# Patient Record
Sex: Male | Born: 1955 | Race: White | Hispanic: No | Marital: Married | Smoking: Former smoker
Health system: Southern US, Community
[De-identification: ages and names within clinical notes are randomized; demographics above are authoritative.]

## PROBLEM LIST (undated history)

## (undated) DIAGNOSIS — E7800 Pure hypercholesterolemia, unspecified: Secondary | ICD-10-CM

## (undated) DIAGNOSIS — I10 Essential (primary) hypertension: Secondary | ICD-10-CM

## (undated) HISTORY — DX: Pure hypercholesterolemia, unspecified: E78.00

## (undated) HISTORY — PX: VASECTOMY: SHX75

## (undated) HISTORY — PX: HERNIA REPAIR: SHX51

## (undated) HISTORY — DX: Essential (primary) hypertension: I10

---

## 2016-01-07 LAB — HM COLONOSCOPY

## 2018-08-29 DIAGNOSIS — I493 Ventricular premature depolarization: Secondary | ICD-10-CM | POA: Diagnosis not present

## 2018-08-29 DIAGNOSIS — Z01810 Encounter for preprocedural cardiovascular examination: Secondary | ICD-10-CM | POA: Diagnosis not present

## 2018-08-29 DIAGNOSIS — M7542 Impingement syndrome of left shoulder: Secondary | ICD-10-CM | POA: Diagnosis not present

## 2018-08-29 DIAGNOSIS — M19012 Primary osteoarthritis, left shoulder: Secondary | ICD-10-CM | POA: Diagnosis not present

## 2018-08-29 DIAGNOSIS — M75122 Complete rotator cuff tear or rupture of left shoulder, not specified as traumatic: Secondary | ICD-10-CM | POA: Diagnosis not present

## 2018-09-23 DIAGNOSIS — S46112A Strain of muscle, fascia and tendon of long head of biceps, left arm, initial encounter: Secondary | ICD-10-CM | POA: Diagnosis not present

## 2018-09-23 DIAGNOSIS — M7542 Impingement syndrome of left shoulder: Secondary | ICD-10-CM | POA: Diagnosis not present

## 2018-09-23 DIAGNOSIS — S46212A Strain of muscle, fascia and tendon of other parts of biceps, left arm, initial encounter: Secondary | ICD-10-CM | POA: Diagnosis not present

## 2018-09-23 DIAGNOSIS — M75122 Complete rotator cuff tear or rupture of left shoulder, not specified as traumatic: Secondary | ICD-10-CM | POA: Diagnosis not present

## 2018-09-23 DIAGNOSIS — S43432A Superior glenoid labrum lesion of left shoulder, initial encounter: Secondary | ICD-10-CM | POA: Diagnosis not present

## 2018-09-23 DIAGNOSIS — X58XXXA Exposure to other specified factors, initial encounter: Secondary | ICD-10-CM | POA: Diagnosis not present

## 2018-09-23 DIAGNOSIS — G8918 Other acute postprocedural pain: Secondary | ICD-10-CM | POA: Diagnosis not present

## 2018-09-23 DIAGNOSIS — M19012 Primary osteoarthritis, left shoulder: Secondary | ICD-10-CM | POA: Diagnosis not present

## 2018-10-26 DIAGNOSIS — Z4789 Encounter for other orthopedic aftercare: Secondary | ICD-10-CM | POA: Diagnosis not present

## 2018-11-01 DIAGNOSIS — Z4789 Encounter for other orthopedic aftercare: Secondary | ICD-10-CM | POA: Diagnosis not present

## 2018-11-09 DIAGNOSIS — Z4789 Encounter for other orthopedic aftercare: Secondary | ICD-10-CM | POA: Diagnosis not present

## 2018-11-16 DIAGNOSIS — Z4789 Encounter for other orthopedic aftercare: Secondary | ICD-10-CM | POA: Diagnosis not present

## 2018-11-21 DIAGNOSIS — Z4789 Encounter for other orthopedic aftercare: Secondary | ICD-10-CM | POA: Diagnosis not present

## 2018-11-23 DIAGNOSIS — Z4789 Encounter for other orthopedic aftercare: Secondary | ICD-10-CM | POA: Diagnosis not present

## 2018-11-28 DIAGNOSIS — Z4789 Encounter for other orthopedic aftercare: Secondary | ICD-10-CM | POA: Diagnosis not present

## 2018-12-01 DIAGNOSIS — Z4789 Encounter for other orthopedic aftercare: Secondary | ICD-10-CM | POA: Diagnosis not present

## 2018-12-05 DIAGNOSIS — Z4789 Encounter for other orthopedic aftercare: Secondary | ICD-10-CM | POA: Diagnosis not present

## 2018-12-07 DIAGNOSIS — Z4789 Encounter for other orthopedic aftercare: Secondary | ICD-10-CM | POA: Diagnosis not present

## 2018-12-12 DIAGNOSIS — Z4789 Encounter for other orthopedic aftercare: Secondary | ICD-10-CM | POA: Diagnosis not present

## 2018-12-14 DIAGNOSIS — Z4789 Encounter for other orthopedic aftercare: Secondary | ICD-10-CM | POA: Diagnosis not present

## 2018-12-21 DIAGNOSIS — Z4789 Encounter for other orthopedic aftercare: Secondary | ICD-10-CM | POA: Diagnosis not present

## 2018-12-26 DIAGNOSIS — Z4789 Encounter for other orthopedic aftercare: Secondary | ICD-10-CM | POA: Diagnosis not present

## 2019-01-17 DIAGNOSIS — K219 Gastro-esophageal reflux disease without esophagitis: Secondary | ICD-10-CM | POA: Diagnosis not present

## 2019-01-17 DIAGNOSIS — I10 Essential (primary) hypertension: Secondary | ICD-10-CM | POA: Diagnosis not present

## 2019-01-17 DIAGNOSIS — Z125 Encounter for screening for malignant neoplasm of prostate: Secondary | ICD-10-CM | POA: Diagnosis not present

## 2019-01-17 DIAGNOSIS — E785 Hyperlipidemia, unspecified: Secondary | ICD-10-CM | POA: Diagnosis not present

## 2019-01-17 DIAGNOSIS — Z0000 Encounter for general adult medical examination without abnormal findings: Secondary | ICD-10-CM | POA: Diagnosis not present

## 2019-01-17 DIAGNOSIS — K5730 Diverticulosis of large intestine without perforation or abscess without bleeding: Secondary | ICD-10-CM | POA: Diagnosis not present

## 2019-01-17 DIAGNOSIS — I129 Hypertensive chronic kidney disease with stage 1 through stage 4 chronic kidney disease, or unspecified chronic kidney disease: Secondary | ICD-10-CM | POA: Diagnosis not present

## 2019-01-18 DIAGNOSIS — N200 Calculus of kidney: Secondary | ICD-10-CM | POA: Diagnosis not present

## 2019-01-18 DIAGNOSIS — N183 Chronic kidney disease, stage 3 (moderate): Secondary | ICD-10-CM | POA: Diagnosis not present

## 2019-01-18 DIAGNOSIS — N281 Cyst of kidney, acquired: Secondary | ICD-10-CM | POA: Diagnosis not present

## 2019-01-25 DIAGNOSIS — I10 Essential (primary) hypertension: Secondary | ICD-10-CM | POA: Diagnosis not present

## 2019-01-25 DIAGNOSIS — N189 Chronic kidney disease, unspecified: Secondary | ICD-10-CM | POA: Diagnosis not present

## 2019-01-25 DIAGNOSIS — N183 Chronic kidney disease, stage 3 (moderate): Secondary | ICD-10-CM | POA: Diagnosis not present

## 2019-01-25 DIAGNOSIS — M3210 Systemic lupus erythematosus, organ or system involvement unspecified: Secondary | ICD-10-CM | POA: Diagnosis not present

## 2019-01-25 DIAGNOSIS — R319 Hematuria, unspecified: Secondary | ICD-10-CM | POA: Diagnosis not present

## 2019-01-25 DIAGNOSIS — R309 Painful micturition, unspecified: Secondary | ICD-10-CM | POA: Diagnosis not present

## 2019-01-25 DIAGNOSIS — R809 Proteinuria, unspecified: Secondary | ICD-10-CM | POA: Diagnosis not present

## 2019-01-25 DIAGNOSIS — M109 Gout, unspecified: Secondary | ICD-10-CM | POA: Diagnosis not present

## 2019-01-25 DIAGNOSIS — E119 Type 2 diabetes mellitus without complications: Secondary | ICD-10-CM | POA: Diagnosis not present

## 2019-02-07 DIAGNOSIS — I10 Essential (primary) hypertension: Secondary | ICD-10-CM | POA: Diagnosis not present

## 2019-02-08 DIAGNOSIS — I10 Essential (primary) hypertension: Secondary | ICD-10-CM | POA: Diagnosis not present

## 2019-02-08 DIAGNOSIS — N281 Cyst of kidney, acquired: Secondary | ICD-10-CM | POA: Diagnosis not present

## 2019-02-24 DIAGNOSIS — N189 Chronic kidney disease, unspecified: Secondary | ICD-10-CM | POA: Diagnosis not present

## 2019-06-13 DIAGNOSIS — I10 Essential (primary) hypertension: Secondary | ICD-10-CM | POA: Diagnosis not present

## 2019-06-13 DIAGNOSIS — R309 Painful micturition, unspecified: Secondary | ICD-10-CM | POA: Diagnosis not present

## 2019-06-13 DIAGNOSIS — D649 Anemia, unspecified: Secondary | ICD-10-CM | POA: Diagnosis not present

## 2019-06-13 DIAGNOSIS — N183 Chronic kidney disease, stage 3 unspecified: Secondary | ICD-10-CM | POA: Diagnosis not present

## 2019-06-13 DIAGNOSIS — R809 Proteinuria, unspecified: Secondary | ICD-10-CM | POA: Diagnosis not present

## 2019-06-13 DIAGNOSIS — N189 Chronic kidney disease, unspecified: Secondary | ICD-10-CM | POA: Diagnosis not present

## 2019-06-13 DIAGNOSIS — M109 Gout, unspecified: Secondary | ICD-10-CM | POA: Diagnosis not present

## 2019-10-16 DIAGNOSIS — N183 Chronic kidney disease, stage 3 unspecified: Secondary | ICD-10-CM | POA: Diagnosis not present

## 2019-10-16 DIAGNOSIS — D649 Anemia, unspecified: Secondary | ICD-10-CM | POA: Diagnosis not present

## 2019-10-16 DIAGNOSIS — M109 Gout, unspecified: Secondary | ICD-10-CM | POA: Diagnosis not present

## 2019-10-16 DIAGNOSIS — N189 Chronic kidney disease, unspecified: Secondary | ICD-10-CM | POA: Diagnosis not present

## 2019-10-16 DIAGNOSIS — I10 Essential (primary) hypertension: Secondary | ICD-10-CM | POA: Diagnosis not present

## 2019-10-28 DIAGNOSIS — R109 Unspecified abdominal pain: Secondary | ICD-10-CM | POA: Diagnosis not present

## 2019-12-07 DIAGNOSIS — M19049 Primary osteoarthritis, unspecified hand: Secondary | ICD-10-CM | POA: Diagnosis not present

## 2020-01-31 DIAGNOSIS — D649 Anemia, unspecified: Secondary | ICD-10-CM | POA: Diagnosis not present

## 2020-01-31 DIAGNOSIS — M109 Gout, unspecified: Secondary | ICD-10-CM | POA: Diagnosis not present

## 2020-01-31 DIAGNOSIS — Z125 Encounter for screening for malignant neoplasm of prostate: Secondary | ICD-10-CM | POA: Diagnosis not present

## 2020-01-31 DIAGNOSIS — I10 Essential (primary) hypertension: Secondary | ICD-10-CM | POA: Diagnosis not present

## 2020-01-31 DIAGNOSIS — E7800 Pure hypercholesterolemia, unspecified: Secondary | ICD-10-CM | POA: Diagnosis not present

## 2020-01-31 DIAGNOSIS — N183 Chronic kidney disease, stage 3 unspecified: Secondary | ICD-10-CM | POA: Diagnosis not present

## 2020-01-31 DIAGNOSIS — N189 Chronic kidney disease, unspecified: Secondary | ICD-10-CM | POA: Diagnosis not present

## 2020-02-12 DIAGNOSIS — N189 Chronic kidney disease, unspecified: Secondary | ICD-10-CM | POA: Diagnosis not present

## 2020-05-02 ENCOUNTER — Ambulatory Visit (INDEPENDENT_AMBULATORY_CARE_PROVIDER_SITE_OTHER): Payer: BC Managed Care – PPO | Admitting: Family Medicine

## 2020-05-02 ENCOUNTER — Encounter: Payer: Self-pay | Admitting: Family Medicine

## 2020-05-02 VITALS — BP 158/60 | HR 63 | Temp 98.1°F | Ht 67.72 in | Wt 233.4 lb

## 2020-05-02 DIAGNOSIS — I10 Essential (primary) hypertension: Secondary | ICD-10-CM | POA: Insufficient documentation

## 2020-05-02 DIAGNOSIS — E785 Hyperlipidemia, unspecified: Secondary | ICD-10-CM

## 2020-05-02 DIAGNOSIS — M722 Plantar fascial fibromatosis: Secondary | ICD-10-CM | POA: Diagnosis not present

## 2020-05-02 MED ORDER — EZETIMIBE 10 MG PO TABS: 10.0000 mg | ORAL_TABLET | Freq: Every day | ORAL | 3 refills | Status: DC

## 2020-05-02 NOTE — Assessment & Plan Note (Signed)
He has done well with zetia, will continue this for now.  Update lipid panel at future physical.

## 2020-05-02 NOTE — Progress Notes (Signed)
Kevin Li - 64 y.o. male MRN [id number]  Date of birth: 08/16/1965  Subjective Chief Complaint  Patient presents with  . Establish Care    HPI Kevin Li is a 64 y.o. male here today for initial visit.  His previous PCP retired.  He has a history of HTN and HLD.  HTN is currently managed with labetalol 200mg  daily.  He was previously on lisinopril however had AKI so this was discontinued.  He has been seeing nephrology and reports that renal function has returned back to normal.  He denies symptoms related to HTN including chest pain, shortness of breath, headache or vision changes.    Cholesterol is managed with zetia.  He does report taking statin in the past and tolerated well.    He is having bilateral foot pain.  Pain located along heels.  Worse when he first gets up in the morning and improves some after walking around.  He has had some improvement with new inserts for shoes.  Denies numbness or tingling.    ROS:  A comprehensive ROS was completed and negative except as noted per HPI  Allergies  Allergen Reactions  . Codeine Nausea Only    Past Medical History:  Diagnosis Date  . High cholesterol   . Hypertension     Past Surgical History:  Procedure Laterality Date  . HERNIA REPAIR     Age 17  . VASECTOMY      Social History   Socioeconomic History  . Marital status: Married    Spouse name: Not on file  . Number of children: Not on file  . Years of education: Not on file  . Highest education level: Not on file  Occupational History  . Not on file  Tobacco Use  . Smoking status: Former Smoker    Quit date: 07/27/1989    Years since quitting: 30.7  . Smokeless tobacco: Never Used  Vaping Use  . Vaping Use: Never used  Substance and Sexual Activity  . Alcohol use: Not on file  . Drug use: Never  . Sexual activity: Yes    Partners: Female  Other Topics Concern  . Not on file  Social History Narrative  . Not on file   Social Determinants of Health    Financial Resource Strain:   . Difficulty of Paying Living Expenses: Not on file  Food Insecurity:   . Worried About Charity fundraiser in the Last Year: Not on file  . Ran Out of Food in the Last Year: Not on file  Transportation Needs:   . Lack of Transportation (Medical): Not on file  . Lack of Transportation (Non-Medical): Not on file  Physical Activity:   . Days of Exercise per Week: Not on file  . Minutes of Exercise per Session: Not on file  Stress:   . Feeling of Stress : Not on file  Social Connections:   . Frequency of Communication with Friends and Family: Not on file  . Frequency of Social Gatherings with Friends and Family: Not on file  . Attends Religious Services: Not on file  . Active Member of Clubs or Organizations: Not on file  . Attends Archivist Meetings: Not on file  . Marital Status: Not on file    History reviewed. No pertinent family history.  Health Maintenance  Topic Date Due  . Hepatitis C Screening  Never done  . HIV Screening  Never done  . TETANUS/TDAP  Never done  . COLONOSCOPY  Never done  . INFLUENZA VACCINE  10/24/2020 (Originally 02/25/2020)  . COVID-19 Vaccine (1) 05/18/2021 (Originally 08/16/1977)     ----------------------------------------------------------------------------------------------------------------------------------------------------------------------------------------------------------------- Physical Exam BP (!) 158/60 (BP Location: Left Arm, Patient Position: Sitting, Cuff Size: Large)   Pulse 63   Temp 98.1 F (36.7 C)   Ht 5' 7.72" (1.72 m)   Wt 233 lb 6.4 oz (105.9 kg)   SpO2 94%   BMI 35.79 kg/m   Physical Exam Constitutional:      Appearance: Normal appearance.  HENT:     Head: Normocephalic and atraumatic.  Eyes:     General: No scleral icterus. Cardiovascular:     Rate and Rhythm: Normal rate and regular rhythm.  Musculoskeletal:     Cervical back: Neck supple.     Comments: TTP along  bilateral heels at insertion of plantar fascia.  Arches are slightly high.    Skin:    General: Skin is warm and dry.  Neurological:     General: No focal deficit present.     Mental Status: He is alert.  Psychiatric:        Mood and Affect: Mood normal.        Behavior: Behavior normal.     ------------------------------------------------------------------------------------------------------------------------------------------------------------------------------------------------------------------- Assessment and Plan  Essential hypertension Blood pressure is not well controlled in clinic today.  Readings at home and nephrology have been much better.  He will continue labetalol at current dose.   In addition they were instructed to follow a low sodium diet with regular exercise to help to maintain adequate control of blood pressure.    HLD (hyperlipidemia) He has done well with zetia, will continue this for now.  Update lipid panel at future physical.   Bilateral plantar fasciitis Given handout of home exercises.  Recommend icing at home.  Referral to Dr. Raeford Razor to evaluate for orthotics and/or injection.    Meds ordered this encounter  Medications  . ezetimibe (ZETIA) 10 MG tablet    Sig: Take 1 tablet (10 mg total) by mouth daily.    Dispense:  90 tablet    Refill:  3    No follow-ups on file.    This visit occurred during the SARS-CoV-2 public health emergency.  Safety protocols were in place, including screening questions prior to the visit, additional usage of staff PPE, and extensive cleaning of exam room while observing appropriate contact time as indicated for disinfecting solutions.

## 2020-05-02 NOTE — Patient Instructions (Signed)
Plantar Fasciitis  Plantar fasciitis is a painful foot condition that affects the heel. It occurs when the band of tissue that connects the toes to the heel bone (plantar fascia) becomes irritated. This can happen as the result of exercising too much or doing other repetitive activities (overuse injury). The pain from plantar fasciitis can range from mild irritation to severe pain that makes it difficult to walk or move. The pain is usually worse in the morning after sleeping, or after sitting or lying down for a while. Pain may also be worse after long periods of walking or standing. What are the causes? This condition may be caused by:  Standing for long periods of time.  Wearing shoes that do not have good arch support.  Doing activities that put stress on joints (high-impact activities), including running, aerobics, and ballet.  Being overweight.  An abnormal way of walking (gait).  Tight muscles in the back of your lower leg (calf).  High arches in your feet.  Starting a new athletic activity. What are the signs or symptoms? The main symptom of this condition is heel pain. Pain may:  Be worse with first steps after a time of rest, especially in the morning after sleeping or after you have been sitting or lying down for a while.  Be worse after long periods of standing still.  Decrease after 30-45 minutes of activity, such as gentle walking. How is this diagnosed? This condition may be diagnosed based on your medical history and your symptoms. Your health care provider may ask questions about your activity level. Your health care provider will do a physical exam to check for:  A tender area on the bottom of your foot.  A high arch in your foot.  Pain when you move your foot.  Difficulty moving your foot. You may have imaging tests to confirm the diagnosis, such as:  X-rays.  Ultrasound.  MRI. How is this treated? Treatment for plantar fasciitis depends on how  severe your condition is. Treatment may include:  Rest, ice, applying pressure (compression), and raising the affected foot (elevation). This may be called RICE therapy. Your health care provider may recommend RICE therapy along with over-the-counter pain medicines to manage your pain.  Exercises to stretch your calves and your plantar fascia.  A splint that holds your foot in a stretched, upward position while you sleep (night splint).  Physical therapy to relieve symptoms and prevent problems in the future.  Injections of steroid medicine (cortisone) to relieve pain and inflammation.  Stimulating your plantar fascia with electrical impulses (extracorporeal shock wave therapy). This is usually the last treatment option before surgery.  Surgery, if other treatments have not worked after 12 months. Follow these instructions at home:  Managing pain, stiffness, and swelling  If directed, put ice on the painful area: ? Put ice in a plastic bag, or use a frozen bottle of water. ? Place a towel between your skin and the bag or bottle. ? Roll the bottom of your foot over the bag or bottle. ? Do this for 20 minutes, 2-3 times a day.  Wear athletic shoes that have air-sole or gel-sole cushions, or try wearing soft shoe inserts that are designed for plantar fasciitis.  Raise (elevate) your foot above the level of your heart while you are sitting or lying down. Activity  Avoid activities that cause pain. Ask your health care provider what activities are safe for you.  Do physical therapy exercises and stretches as told   by your health care provider.  Try activities and forms of exercise that are easier on your joints (low-impact). Examples include swimming, water aerobics, and biking. General instructions  Take over-the-counter and prescription medicines only as told by your health care provider.  Wear a night splint while sleeping, if told by your health care provider. Loosen the splint  if your toes tingle, become numb, or turn cold and blue.  Maintain a healthy weight, or work with your health care provider to lose weight as needed.  Keep all follow-up visits as told by your health care provider. This is important. Contact a health care provider if you:  Have symptoms that do not go away after caring for yourself at home.  Have pain that gets worse.  Have pain that affects your ability to move or do your daily activities. Summary  Plantar fasciitis is a painful foot condition that affects the heel. It occurs when the band of tissue that connects the toes to the heel bone (plantar fascia) becomes irritated.  The main symptom of this condition is heel pain that may be worse after exercising too much or standing still for a long time.  Treatment varies, but it usually starts with rest, ice, compression, and elevation (RICE therapy) and over-the-counter medicines to manage pain. This information is not intended to replace advice given to you by your health care provider. Make sure you discuss any questions you have with your health care provider. Document Revised: 06/25/2017 Document Reviewed: 05/10/2017 Elsevier Patient Education  2020 [employer]  Plantar Fasciitis Rehab Ask your health care provider which exercises are safe for you. Do exercises exactly as told by your health care provider and adjust them as directed. It is normal to feel mild stretching, pulling, tightness, or discomfort as you do these exercises. Stop right away if you feel sudden pain or your pain gets worse. Do not begin these exercises until told by your health care provider. Stretching and range-of-motion exercises These exercises warm up your muscles and joints and improve the movement and flexibility of your foot. These exercises also help to relieve pain. Plantar fascia stretch  1. Sit with your left / right leg crossed over your opposite knee. 2. Hold your heel with one hand with that  thumb near your arch. With your other hand, hold your toes and gently pull them back toward the top of your foot. You should feel a stretch on the bottom of your toes or your foot (plantar fascia) or both. 3. Hold this stretch for__________ seconds. 4. Slowly release your toes and return to the starting position. Repeat __________ times. Complete this exercise __________ times a day. Gastrocnemius stretch, standing This exercise is also called a calf (gastroc) stretch. It stretches the muscles in the back of the upper calf. 1. Stand with your hands against a wall. 2. Extend your left / right leg behind you, and bend your front knee slightly. 3. Keeping your heels on the floor and your back knee straight, shift your weight toward the wall. Do not arch your back. You should feel a gentle stretch in your upper left / right calf. 4. Hold this position for __________ seconds. Repeat __________ times. Complete this exercise __________ times a day. Soleus stretch, standing This exercise is also called a calf (soleus) stretch. It stretches the muscles in the back of the lower calf. 1. Stand with your hands against a wall. 2. Extend your left / right leg behind you, and bend your front   knee slightly. 3. Keeping your heels on the floor, bend your back knee and shift your weight slightly over your back leg. You should feel a gentle stretch deep in your lower calf. 4. Hold this position for __________ seconds. Repeat __________ times. Complete this exercise __________ times a day. Gastroc and soleus stretch, standing step This exercise stretches the muscles in the back of the lower leg. These muscles are in the upper calf (gastrocnemius) and the lower calf (soleus). 1. Stand with the ball of your left / right foot on a step. The ball of your foot is on the walking surface, right under your toes. 2. Keep your other foot firmly on the same step. 3. Hold on to the wall or a railing for balance. 4. Slowly  lift your other foot, allowing your body weight to press your left / right heel down over the edge of the step. You should feel a stretch in your left / right calf. 5. Hold this position for __________ seconds. 6. Return both feet to the step. 7. Repeat this exercise with a slight bend in your left / right knee. Repeat __________ times with your left / right knee straight and __________ times with your left / right knee bent. Complete this exercise __________ times a day. Balance exercise This exercise builds your balance and strength control of your arch to help take pressure off your plantar fascia. Single leg stand If this exercise is too easy, you can try it with your eyes closed or while standing on a pillow. 1. Without shoes, stand near a railing or in a doorway. You may hold on to the railing or door frame as needed. 2. Stand on your left / right foot. Keep your big toe down on the floor and try to keep your arch lifted. Do not let your foot roll inward. 3. Hold this position for __________ seconds. Repeat __________ times. Complete this exercise __________ times a day. This information is not intended to replace advice given to you by your health care provider. Make sure you discuss any questions you have with your health care provider. Document Revised: 11/03/2018 Document Reviewed: 05/11/2018 Elsevier Patient Education  2020 Else[employer] Inc.  

## 2020-05-02 NOTE — Assessment & Plan Note (Addendum)
Given handout of home exercises.  Recommend icing at home.  Referral to Dr. Raeford Razor to evaluate for orthotics and/or injection.

## 2020-05-02 NOTE — Assessment & Plan Note (Signed)
Blood pressure is not well controlled in clinic today.  Readings at home and nephrology have been much better.  He will continue labetalol at current dose.   In addition they were instructed to follow a low sodium diet with regular exercise to help to maintain adequate control of blood pressure.

## 2020-05-07 ENCOUNTER — Telehealth: Payer: Self-pay | Admitting: Family Medicine

## 2020-05-07 NOTE — Telephone Encounter (Signed)
Received call from patient with questions about his visit on 10/7. During our conversation he mentioned that [facility] was supposed to be sending something for his ezetimibe I asked him if it was requiring a prior auth and he stated yes.  I sent PA through cover my meds and received message that the drug is covered by the plan and does not require a PA.  - CF

## 2020-05-14 ENCOUNTER — Other Ambulatory Visit: Payer: Self-pay

## 2020-05-14 ENCOUNTER — Ambulatory Visit (INDEPENDENT_AMBULATORY_CARE_PROVIDER_SITE_OTHER): Payer: BC Managed Care – PPO | Admitting: Family Medicine

## 2020-05-14 ENCOUNTER — Encounter: Payer: Self-pay | Admitting: Family Medicine

## 2020-05-14 DIAGNOSIS — M722 Plantar fascial fibromatosis: Secondary | ICD-10-CM

## 2020-05-14 NOTE — Progress Notes (Signed)
Kevin Li - 64 y.o. male MRN [id number]  Date of birth: 1955/11/18  SUBJECTIVE:  Including CC & ROS.  Chief Complaint  Patient presents with  . Foot Pain    bilateral x 1.5 years    Kevin Li is a 64 y.o. male that is presenting with bilateral foot pain.  The pain has been ongoing for over a year.  He notices the pain with the first few steps in the morning.  He has been under the plantar aspect of each heel.  No improvement with modalities today.   Review of Systems See HPI   HISTORY: Past Medical, Surgical, Social, and Family History Reviewed & Updated per EMR.   Pertinent Historical Findings include:  Past Medical History:  Diagnosis Date  . High cholesterol   . Hypertension     Past Surgical History:  Procedure Laterality Date  . HERNIA REPAIR     Age 86  . VASECTOMY      No family history on file.  Social History   Socioeconomic History  . Marital status: Married    Spouse name: Not on file  . Number of children: Not on file  . Years of education: Not on file  . Highest education level: Not on file  Occupational History  . Not on file  Tobacco Use  . Smoking status: Former Smoker    Quit date: 07/27/1989    Years since quitting: 30.8  . Smokeless tobacco: Never Used  Vaping Use  . Vaping Use: Never used  Substance and Sexual Activity  . Alcohol use: Not on file  . Drug use: Never  . Sexual activity: Yes    Partners: Female  Other Topics Concern  . Not on file  Social History Narrative  . Not on file   Social Determinants of Health   Financial Resource Strain:   . Difficulty of Paying Living Expenses: Not on file  Food Insecurity:   . Worried About Charity fundraiser in the Last Year: Not on file  . Ran Out of Food in the Last Year: Not on file  Transportation Needs:   . Lack of Transportation (Medical): Not on file  . Lack of Transportation (Non-Medical): Not on file  Physical Activity:   . Days of Exercise per Week: Not on file  .  Minutes of Exercise per Session: Not on file  Stress:   . Feeling of Stress : Not on file  Social Connections:   . Frequency of Communication with Friends and Family: Not on file  . Frequency of Social Gatherings with Friends and Family: Not on file  . Attends Religious Services: Not on file  . Active Member of Clubs or Organizations: Not on file  . Attends Archivist Meetings: Not on file  . Marital Status: Not on file  Intimate Partner Violence:   . Fear of Current or Ex-Partner: Not on file  . Emotionally Abused: Not on file  . Physically Abused: Not on file  . Sexually Abused: Not on file     PHYSICAL EXAM:  VS: BP (!) 145/81   Pulse (!) 55   Ht 5\' 8"  (1.727 m)   Wt 227 lb (103 kg)   BMI 34.52 kg/m  Physical Exam Gen: NAD, alert, cooperative with exam, well-appearing MSK:  Right and left foot clean Tenderness palpation over the calcaneus plantar aspect. No swelling or ecchymosis. Fairly cavus foot. Neurovascular intact  Patient was fitted for a standard, cushioned, semi-rigid orthotic. The orthotic  was heated and afterward the patient stood on the orthotic blank positioned on the orthotic stand. The patient was positioned in subtalar neutral position and 10 degrees of ankle dorsiflexion in a weight bearing stance. After completion of molding, a stable base was applied to the orthotic blank. The blank was ground to a stable position for weight bearing. Size: 10 Pairs: 2 Base: Blue EVA Additional Posting and Padding: None The patient ambulated these, and they were very comfortable.   ASSESSMENT & PLAN:   Bilateral plantar fasciitis Symptoms seem to be consistent with plantar fasciitis.  Has been ongoing for about a year now. -Counseled on home exercise therapy and supportive care. -Orthotics. -Midfoot arch strap. -Could consider injections if no improvement.

## 2020-05-14 NOTE — Patient Instructions (Signed)
Nice to meet you Please try the straps  Please try the exercises   Please send me a message in MyChart with any questions or updates.  Please see me back in 4 weeks.   --Dr. Raeford Razor

## 2020-05-14 NOTE — Assessment & Plan Note (Signed)
Symptoms seem to be consistent with plantar fasciitis.  Has been ongoing for about a year now. -Counseled on home exercise therapy and supportive care. -Orthotics. -Midfoot arch strap. -Could consider injections if no improvement.

## 2020-06-06 DIAGNOSIS — N189 Chronic kidney disease, unspecified: Secondary | ICD-10-CM | POA: Diagnosis not present

## 2020-06-06 DIAGNOSIS — E119 Type 2 diabetes mellitus without complications: Secondary | ICD-10-CM | POA: Diagnosis not present

## 2020-06-06 DIAGNOSIS — M109 Gout, unspecified: Secondary | ICD-10-CM | POA: Diagnosis not present

## 2020-06-06 DIAGNOSIS — R309 Painful micturition, unspecified: Secondary | ICD-10-CM | POA: Diagnosis not present

## 2020-06-06 DIAGNOSIS — D649 Anemia, unspecified: Secondary | ICD-10-CM | POA: Diagnosis not present

## 2020-06-14 ENCOUNTER — Ambulatory Visit (INDEPENDENT_AMBULATORY_CARE_PROVIDER_SITE_OTHER): Payer: BC Managed Care – PPO | Admitting: Family Medicine

## 2020-06-14 ENCOUNTER — Ambulatory Visit (HOSPITAL_BASED_OUTPATIENT_CLINIC_OR_DEPARTMENT_OTHER)
Admission: RE | Admit: 2020-06-14 | Discharge: 2020-06-14 | Disposition: A | Discharge status: Home / Self Care | Payer: BC Managed Care – PPO | Source: Ambulatory Visit | Attending: Family Medicine | Admitting: Family Medicine

## 2020-06-14 ENCOUNTER — Ambulatory Visit: Payer: Self-pay

## 2020-06-14 ENCOUNTER — Other Ambulatory Visit: Payer: Self-pay

## 2020-06-14 VITALS — BP 130/70 | Ht 68.0 in | Wt 224.0 lb

## 2020-06-14 DIAGNOSIS — M722 Plantar fascial fibromatosis: Secondary | ICD-10-CM

## 2020-06-14 DIAGNOSIS — M79672 Pain in left foot: Secondary | ICD-10-CM | POA: Diagnosis not present

## 2020-06-14 DIAGNOSIS — K5730 Diverticulosis of large intestine without perforation or abscess without bleeding: Secondary | ICD-10-CM | POA: Diagnosis not present

## 2020-06-14 DIAGNOSIS — I700 Atherosclerosis of aorta: Secondary | ICD-10-CM | POA: Diagnosis not present

## 2020-06-14 DIAGNOSIS — N400 Enlarged prostate without lower urinary tract symptoms: Secondary | ICD-10-CM | POA: Diagnosis not present

## 2020-06-14 DIAGNOSIS — R300 Dysuria: Secondary | ICD-10-CM | POA: Diagnosis not present

## 2020-06-14 DIAGNOSIS — M79671 Pain in right foot: Secondary | ICD-10-CM | POA: Diagnosis not present

## 2020-06-14 DIAGNOSIS — N200 Calculus of kidney: Secondary | ICD-10-CM | POA: Diagnosis not present

## 2020-06-14 DIAGNOSIS — I708 Atherosclerosis of other arteries: Secondary | ICD-10-CM | POA: Diagnosis not present

## 2020-06-14 MED ORDER — TRIAMCINOLONE ACETONIDE 40 MG/ML IJ SUSP
40.0000 mg | Freq: Once | INTRAMUSCULAR | Status: AC
  Administered 2020-06-14: 40 mg via INTRA_ARTICULAR

## 2020-06-14 NOTE — Assessment & Plan Note (Signed)
Pain is still ongoing.  He has been experiencing this pain for about a year now.  Little improvement with orthotics. -Counseled on home exercise therapy and supportive care. -Injections bilaterally. -X-rays. -Could consider physical therapy or further imaging.

## 2020-06-14 NOTE — Patient Instructions (Signed)
Good to see you Please continue the tennis ball Please use ice as needed  I will call with the results from today   Please send me a message in MyChart with any questions or updates.  Please see me back in 4 weeks.   --Dr. Raeford Razor

## 2020-06-14 NOTE — Progress Notes (Signed)
Kevin Li - 64 y.o. male MRN [id number]  Date of birth: [birth year]  SUBJECTIVE:  Including CC & ROS.  No chief complaint on file.   Kevin Li is a 64 y.o. male that is presenting with acute worsening of his bilateral foot pain.  The pain is worse at the end of the day.  Has been ongoing for about a year now.  Little improvement with the orthotics.   Review of Systems See HPI   HISTORY: Past Medical, Surgical, Social, and Family History Reviewed & Updated per EMR.   Pertinent Historical Findings include:  Past Medical History:  Diagnosis Date  . High cholesterol   . Hypertension     Past Surgical History:  Procedure Laterality Date  . HERNIA REPAIR     Age 64  . VASECTOMY      No family history on file.  Social History   Socioeconomic History  . Marital status: Married    Spouse name: Not on file  . Number of children: Not on file  . Years of education: Not on file  . Highest education level: Not on file  Occupational History  . Not on file  Tobacco Use  . Smoking status: Former Smoker    Quit date: 07/27/1989    Years since quitting: 30.9  . Smokeless tobacco: Never Used  Vaping Use  . Vaping Use: Never used  Substance and Sexual Activity  . Alcohol use: Not on file  . Drug use: Never  . Sexual activity: Yes    Partners: Female  Other Topics Concern  . Not on file  Social History Narrative  . Not on file   Social Determinants of Health   Financial Resource Strain:   . Difficulty of Paying Living Expenses: Not on file  Food Insecurity:   . Worried About Charity fundraiser in the Last Year: Not on file  . Ran Out of Food in the Last Year: Not on file  Transportation Needs:   . Lack of Transportation (Medical): Not on file  . Lack of Transportation (Non-Medical): Not on file  Physical Activity:   . Days of Exercise per Week: Not on file  . Minutes of Exercise per Session: Not on file  Stress:   . Feeling of Stress : Not on file  Social  Connections:   . Frequency of Communication with Friends and Family: Not on file  . Frequency of Social Gatherings with Friends and Family: Not on file  . Attends Religious Services: Not on file  . Active Member of Clubs or Organizations: Not on file  . Attends Archivist Meetings: Not on file  . Marital Status: Not on file  Intimate Partner Violence:   . Fear of Current or Ex-Partner: Not on file  . Emotionally Abused: Not on file  . Physically Abused: Not on file  . Sexually Abused: Not on file     PHYSICAL EXAM:  VS: BP 130/70   Ht 5\' 8"  (1.727 m)   Wt 224 lb (101.6 kg)   BMI 34.06 kg/m  Physical Exam Gen: NAD, alert, cooperative with exam, well-appearing MSK:  Right and left foot: Fairly cavus foot. Normal range of motion. Normal strength resistance. Neurovascular intact  Limited ultrasound: Right foot, left foot:  Right foot: Thickened and hypoechoic change at the origin of the plantar fascia. Measured to be thickened of the normal. Some hyperemia at the origin of the plantar fascia.  Left foot: Thickening and hypoechoic change of the  plantar fascia.  Not as thick as the right however. No hyperemia.   Summary: Findings consistent with plantar fasciitis bilaterally  Ultrasound and interpretation by Clearance Coots, MD   Aspiration/Injection Procedure Note Tomer Chalmers 1955-08-09  Procedure: Injection Indications: Left plantar fasciitis  Procedure Details Consent: Risks of procedure as well as the alternatives and risks of each were explained to the (patient/caregiver).  Consent for procedure obtained. Time Out: Verified patient identification, verified procedure, site/side was marked, verified correct patient position, special equipment/implants available, medications/allergies/relevent history reviewed, required imaging and test results available.  Performed.  The area was cleaned with iodine and alcohol swabs.    The left plantar fashion was  injected using 1 cc's of 40 mg Kenalog and 2 cc's of 0.25% bupivacaine with a 25 1 1/2" needle.  Ultrasound was used. Images were obtained in long views showing the injection.     A sterile dressing was applied.  Patient did tolerate procedure well.  Aspiration/Injection Procedure Note Nidal Rivet [birth year]  Procedure: Injection Indications: Right plantar fasciitis  Procedure Details Consent: Risks of procedure as well as the alternatives and risks of each were explained to the (patient/caregiver).  Consent for procedure obtained. Time Out: Verified patient identification, verified procedure, site/side was marked, verified correct patient position, special equipment/implants available, medications/allergies/relevent history reviewed, required imaging and test results available.  Performed.  The area was cleaned with iodine and alcohol swabs.    The right plantar fashion was injected using 1 cc's of 40 mg Kenalog and 2 cc's of 0.25% bupivacaine with a 25 1 1/2" needle.  Ultrasound was used. Images were obtained in long views showing the injection.     A sterile dressing was applied.  Patient did tolerate procedure well.  ASSESSMENT & PLAN:   Bilateral plantar fasciitis Pain is still ongoing.  He has been experiencing this pain for about a year now.  Little improvement with orthotics. -Counseled on home exercise therapy and supportive care. -Injections bilaterally. -X-rays. -Could consider physical therapy or further imaging.

## 2020-06-17 ENCOUNTER — Telehealth: Payer: Self-pay | Admitting: Family Medicine

## 2020-06-17 NOTE — Telephone Encounter (Signed)
Informed of results.   Rosemarie Ax, MD [facility] 06/17/2020, 8:44 AM

## 2020-07-12 ENCOUNTER — Encounter: Payer: Self-pay | Admitting: Family Medicine

## 2020-07-12 ENCOUNTER — Other Ambulatory Visit: Payer: Self-pay

## 2020-07-12 ENCOUNTER — Ambulatory Visit (INDEPENDENT_AMBULATORY_CARE_PROVIDER_SITE_OTHER): Payer: BC Managed Care – PPO | Admitting: Family Medicine

## 2020-07-12 DIAGNOSIS — M722 Plantar fascial fibromatosis: Secondary | ICD-10-CM | POA: Diagnosis not present

## 2020-07-12 NOTE — Assessment & Plan Note (Signed)
Has had significant improvement with the injections.  No pain on the right.  Intermittent and mild pain in the left. -Counseled on continue to stretch. -Counseled on mid foot arch straps. -Could consider physical therapy.

## 2020-07-12 NOTE — Progress Notes (Signed)
  Kevin Li - 64 y.o. male MRN [id number]  Date of birth: [birth year]  SUBJECTIVE:  Including CC & ROS.  Chief Complaint  Patient presents with  . Follow-up    Bilateral foot    Kevin Li is a 64 y.o. male that is following up for his bilateral plantar fasciitis.  He has had significant improvement with last injections.  Denies any pain on the right.  Only has intermittent pain in the left.  Review of Systems See HPI   HISTORY: Past Medical, Surgical, Social, and Family History Reviewed & Updated per EMR.   Pertinent Historical Findings include:  Past Medical History:  Diagnosis Date  . High cholesterol   . Hypertension     Past Surgical History:  Procedure Laterality Date  . HERNIA REPAIR     Age 24  . VASECTOMY      No family history on file.  Social History   Socioeconomic History  . Marital status: Married    Spouse name: Not on file  . Number of children: Not on file  . Years of education: Not on file  . Highest education level: Not on file  Occupational History  . Not on file  Tobacco Use  . Smoking status: Former Smoker    Quit date: 07/27/1989    Years since quitting: 30.9  . Smokeless tobacco: Never Used  Vaping Use  . Vaping Use: Never used  Substance and Sexual Activity  . Alcohol use: Not on file  . Drug use: Never  . Sexual activity: Yes    Partners: Female  Other Topics Concern  . Not on file  Social History Narrative  . Not on file   Social Determinants of Health   Financial Resource Strain: Not on file  Food Insecurity: Not on file  Transportation Needs: Not on file  Physical Activity: Not on file  Stress: Not on file  Social Connections: Not on file  Intimate Partner Violence: Not on file     PHYSICAL EXAM:  VS: BP (!) 144/78   Pulse (!) 55   Ht 5\' 8"  (1.727 m)   Wt 225 lb (102.1 kg)   BMI 34.21 kg/m  Physical Exam Gen: NAD, alert, cooperative with exam, well-appearing    ASSESSMENT & PLAN:   Bilateral plantar  fasciitis Has had significant improvement with the injections.  No pain on the right.  Intermittent and mild pain in the left. -Counseled on continue to stretch. -Counseled on mid foot arch straps. -Could consider physical therapy.

## 2020-08-26 ENCOUNTER — Telehealth: Payer: Self-pay

## 2020-08-26 NOTE — Telephone Encounter (Signed)
Pt came in to give insurance info. He wanted the provider to send the Rx's mail order and stated that all we had to do is got to the site.

## 2020-08-27 ENCOUNTER — Other Ambulatory Visit: Payer: Self-pay | Admitting: Family Medicine

## 2020-08-27 MED ORDER — EZETIMIBE 10 MG PO TABS
10.0000 mg | ORAL_TABLET | Freq: Every day | ORAL | 3 refills | Status: DC
Start: 2020-08-27 — End: 2021-08-26

## 2020-08-27 MED ORDER — LABETALOL HCL 200 MG PO TABS
200.0000 mg | ORAL_TABLET | Freq: Two times a day (BID) | ORAL | 1 refills | Status: DC
Start: 2020-08-27 — End: 2021-03-14

## 2020-08-27 NOTE — Telephone Encounter (Signed)
Rx updated and sent to [facility]

## 2020-08-27 NOTE — Telephone Encounter (Signed)
What site and mail order pharmacy??

## 2020-08-27 NOTE — Telephone Encounter (Signed)
Pt stated that with doing a mail order with them you have to be very specific. The site is [email or web address]   It's [facility] [place]  [phone]

## 2021-01-06 ENCOUNTER — Encounter: Payer: Medicare Other | Admitting: Family Medicine

## 2021-01-09 ENCOUNTER — Encounter: Payer: Self-pay | Admitting: Family Medicine

## 2021-01-09 ENCOUNTER — Ambulatory Visit (INDEPENDENT_AMBULATORY_CARE_PROVIDER_SITE_OTHER): Payer: Medicare Other

## 2021-01-09 ENCOUNTER — Other Ambulatory Visit: Payer: Self-pay

## 2021-01-09 ENCOUNTER — Ambulatory Visit (INDEPENDENT_AMBULATORY_CARE_PROVIDER_SITE_OTHER): Payer: Medicare Other | Admitting: Family Medicine

## 2021-01-09 VITALS — BP 138/70 | HR 63 | Temp 98.4°F | Ht 68.0 in | Wt 228.0 lb

## 2021-01-09 DIAGNOSIS — I10 Essential (primary) hypertension: Secondary | ICD-10-CM | POA: Diagnosis not present

## 2021-01-09 DIAGNOSIS — M545 Low back pain, unspecified: Secondary | ICD-10-CM | POA: Diagnosis not present

## 2021-01-09 DIAGNOSIS — E785 Hyperlipidemia, unspecified: Secondary | ICD-10-CM | POA: Diagnosis not present

## 2021-01-09 DIAGNOSIS — Z23 Encounter for immunization: Secondary | ICD-10-CM

## 2021-01-09 DIAGNOSIS — Z0000 Encounter for general adult medical examination without abnormal findings: Secondary | ICD-10-CM | POA: Diagnosis not present

## 2021-01-09 DIAGNOSIS — Z1159 Encounter for screening for other viral diseases: Secondary | ICD-10-CM

## 2021-01-09 MED ORDER — PREDNISONE 10 MG (21) PO TBPK: ORAL_TABLET | ORAL | 0 refills | Status: DC

## 2021-01-09 NOTE — Patient Instructions (Signed)
I would recommend having updated Tetanus and Shingles vaccines at your pharmacy.

## 2021-01-10 LAB — CBC WITH DIFFERENTIAL/PLATELET
Absolute Monocytes: 959 cells/uL — ABNORMAL HIGH (ref 200–950)
Basophils Absolute: 47 cells/uL (ref 0–200)
Basophils Relative: 0.6 %
Eosinophils Absolute: 445 cells/uL (ref 15–500)
Eosinophils Relative: 5.7 %
HCT: 49.9 % (ref 38.5–50.0)
Hemoglobin: 16.6 g/dL (ref 13.2–17.1)
Lymphs Abs: 1677 cells/uL (ref 850–3900)
MCH: 29.9 pg (ref 27.0–33.0)
MCHC: 33.3 g/dL (ref 32.0–36.0)
MCV: 89.7 fL (ref 80.0–100.0)
MPV: 10 fL (ref 7.5–12.5)
Monocytes Relative: 12.3 %
Neutro Abs: 4672 cells/uL (ref 1500–7800)
Neutrophils Relative %: 59.9 %
Platelets: 311 10*3/uL (ref 140–400)
RBC: 5.56 10*6/uL (ref 4.20–5.80)
RDW: 12.9 % (ref 11.0–15.0)
Total Lymphocyte: 21.5 %
WBC: 7.8 10*3/uL (ref 3.8–10.8)

## 2021-01-10 LAB — LIPID PANEL W/REFLEX DIRECT LDL
Cholesterol: 207 mg/dL — ABNORMAL HIGH (ref <200)
HDL: 61 mg/dL (ref >=40)
LDL Cholesterol (Calc): 121 mg/dL (calc) — ABNORMAL HIGH
Non-HDL Cholesterol (Calc): 146 mg/dL (calc) — ABNORMAL HIGH (ref <130)
Total CHOL/HDL Ratio: 3.4 (calc) (ref <5.0)
Triglycerides: 135 mg/dL (ref <150)

## 2021-01-10 LAB — COMPLETE METABOLIC PANEL WITH GFR
AG Ratio: 2.3 (calc) (ref 1.0–2.5)
ALT: 21 U/L (ref 9–46)
AST: 17 U/L (ref 10–35)
Albumin: 4.6 g/dL (ref 3.6–5.1)
Alkaline phosphatase (APISO): 67 U/L (ref 35–144)
BUN/Creatinine Ratio: 11 (calc) (ref 6–22)
BUN: 20 mg/dL (ref 7–25)
CO2: 28 mmol/L (ref 20–32)
Calcium: 9.9 mg/dL (ref 8.6–10.3)
Chloride: 105 mmol/L (ref 98–110)
Creat: 1.87 mg/dL — ABNORMAL HIGH (ref 0.70–1.25)
GFR, Est African American: 43 mL/min/{1.73_m2} — ABNORMAL LOW (ref >=60)
GFR, Est Non African American: 37 mL/min/{1.73_m2} — ABNORMAL LOW (ref >=60)
Globulin: 2 g/dL (calc) (ref 1.9–3.7)
Glucose, Bld: 95 mg/dL (ref 65–99)
Potassium: 4.2 mmol/L (ref 3.5–5.3)
Sodium: 141 mmol/L (ref 135–146)
Total Bilirubin: 1.2 mg/dL (ref 0.2–1.2)
Total Protein: 6.6 g/dL (ref 6.1–8.1)

## 2021-01-10 LAB — HEPATITIS C ANTIBODY
Hepatitis C Ab: NONREACTIVE
SIGNAL TO CUT-OFF: 0 (ref <1.00)

## 2021-01-12 DIAGNOSIS — M545 Low back pain, unspecified: Secondary | ICD-10-CM | POA: Insufficient documentation

## 2021-01-12 NOTE — Assessment & Plan Note (Signed)
Xrays of lumbar spine ordered.  Starting prednisone taper.

## 2021-01-12 NOTE — Assessment & Plan Note (Signed)
Update lipid panel.  

## 2021-01-12 NOTE — Progress Notes (Signed)
Kevin Li - 65 y.o. male MRN [id number]  Date of birth: [birth year]  Subjective Chief Complaint  Patient presents with   Medicare Wellness    HPI Kevin Li is a 65 y.o. male here today for follow up of chronic conditions.    He is also having some lower back pain.  He has had this off and on for several months.  He denies radiation of pain, numbness or tingling. No prior imaging.    BP remains well controlled with labetalol and amlodipine. Denies side effects with current medications.   Taking zetia for management of HLD.  Has intolerance to statins.   ROS:  A comprehensive ROS was completed and negative except as noted per HPI  Allergies  Allergen Reactions   Statins Other (See Comments)    Myalgias    Codeine Nausea Only    Past Medical History:  Diagnosis Date   High cholesterol    Hypertension     Past Surgical History:  Procedure Laterality Date   HERNIA REPAIR     Age 52   VASECTOMY      Social History   Socioeconomic History   Marital status: Married    Spouse name: Not on file   Number of children: Not on file   Years of education: Not on file   Highest education level: Not on file  Occupational History   Not on file  Tobacco Use   Smoking status: Former    Pack years: 0.00    Types: Cigarettes    Quit date: 07/27/1989    Years since quitting: 31.4   Smokeless tobacco: Never  Vaping Use   Vaping Use: Never used  Substance and Sexual Activity   Alcohol use: Not on file   Drug use: Never   Sexual activity: Yes    Partners: Female  Other Topics Concern   Not on file  Social History Narrative   Not on file   Social Determinants of Health   Financial Resource Strain: Not on file  Food Insecurity: Not on file  Transportation Needs: Not on file  Physical Activity: Not on file  Stress: Not on file  Social Connections: Not on file    History reviewed. No pertinent family history.  Health Maintenance  Topic Date Due   HIV Screening   Never done   COLONOSCOPY (Pts 45-8yrs Insurance coverage will need to be confirmed)  Never done   Zoster Vaccines- Shingrix (1 of 2) Never done   PNA vac Low Risk Adult (1 of 2 - PCV13) 08/16/2020   TETANUS/TDAP  10/19/2020   COVID-19 Vaccine (1) 05/18/2021 (Originally 08/16/1960)   INFLUENZA VACCINE  02/24/2021   Hepatitis C Screening  Completed   HPV VACCINES  Aged Out     ----------------------------------------------------------------------------------------------------------------------------------------------------------------------------------------------------------------- Physical Exam BP 138/70   Pulse 63   Temp 98.4 F (36.9 C)   Ht 5\' 8"  (1.727 m)   Wt 228 lb (103.4 kg)   SpO2 95%   BMI 34.67 kg/m   Physical Exam Constitutional:      Appearance: Normal appearance.  Eyes:     General: No scleral icterus. Cardiovascular:     Rate and Rhythm: Normal rate and regular rhythm.  Pulmonary:     Effort: Pulmonary effort is normal.     Breath sounds: Normal breath sounds.  Musculoskeletal:     Comments: ROM of lumbar spine is normal.  No ttp along paraspinal muscles.  Strength is normal in lower extremities.   Skin:  General: Skin is warm and dry.  Neurological:     General: No focal deficit present.     Mental Status: He is alert.  Psychiatric:        Mood and Affect: Mood normal.        Behavior: Behavior normal.    ------------------------------------------------------------------------------------------------------------------------------------------------------------------------------------------------------------------- Assessment and Plan  Essential hypertension Blood pressure is at goal at for age and co-morbidities.  I recommend continuation of current medication.  In addition they were instructed to follow a low sodium diet with regular exercise to help to maintain adequate control of blood pressure.    HLD (hyperlipidemia) Update lipid panel    Low back pain Xrays of lumbar spine ordered.  Starting prednisone taper.     Meds ordered this encounter  Medications   predniSONE (STERAPRED UNI-PAK 21 TAB) 10 MG (21) TBPK tablet    Sig: Taper as directed on packaging    Dispense:  21 tablet    Refill:  0    No follow-ups on file.    This visit occurred during the SARS-CoV-2 public health emergency.  Safety protocols were in place, including screening questions prior to the visit, additional usage of staff PPE, and extensive cleaning of exam room while observing appropriate contact time as indicated for disinfecting solutions.

## 2021-01-12 NOTE — Progress Notes (Signed)
Kevin Li - 64 y.o. male MRN [id number]  Date of birth: 1956-05-28   Subjective Chief Complaint  Patient presents with   Medicare Wellness    HPI Kevin Li is a 65 y.o. male here today for initial annual wellness visit.    Allergies  Allergen Reactions   Statins Other (See Comments)    Myalgias    Codeine Nausea Only    Past Medical History:  Diagnosis Date   High cholesterol    Hypertension     Past Surgical History:  Procedure Laterality Date   HERNIA REPAIR     Age 4   VASECTOMY      Social History   Socioeconomic History   Marital status: Married    Spouse name: Not on file   Number of children: Not on file   Years of education: Not on file   Highest education level: Not on file  Occupational History   Not on file  Tobacco Use   Smoking status: Former    Pack years: 0.00    Types: Cigarettes    Quit date: 07/27/1989    Years since quitting: 31.4   Smokeless tobacco: Never  Vaping Use   Vaping Use: Never used  Substance and Sexual Activity   Alcohol use: Not on file   Drug use: Never   Sexual activity: Yes    Partners: Female  Other Topics Concern   Not on file  Social History Narrative   Not on file   Social Determinants of Health   Financial Resource Strain: Not on file  Food Insecurity: Not on file  Transportation Needs: Not on file  Physical Activity: Not on file  Stress: Not on file  Social Connections: Not on file    History reviewed. No pertinent family history.  Health Maintenance  Topic Date Due   HIV Screening  Never done   COLONOSCOPY (Pts 45-80yrs Insurance coverage will need to be confirmed)  Never done   Zoster Vaccines- Shingrix (1 of 2) Never done   PNA vac Low Risk Adult (1 of 2 - PCV13) 08/16/2020   TETANUS/TDAP  10/19/2020   COVID-19 Vaccine (1) 05/18/2021 (Originally [birth year]16/1960)   INFLUENZA VACCINE  02/24/2021   Hepatitis C Screening  Completed   HPV VACCINES  Aged Out    Depression screen [facility] 2/9 01/09/2021  05/02/2020  Decreased Interest 0 0  Down, Depressed, Hopeless 0 0  PHQ - 2 Score 0 0  Altered sleeping - 0  Tired, decreased energy - 0  Change in appetite - 0  Feeling bad or failure about yourself  - 0  Trouble concentrating - 0  Moving slowly or fidgety/restless - 0  Suicidal thoughts - 0  PHQ-9 Score - 0    Current Meds  Medication Sig   amLODipine (NORVASC) 2.5 MG tablet    ezetimibe (ZETIA) 10 MG tablet Take 1 tablet (10 mg total) by mouth daily.   labetalol (NORMODYNE) 200 MG tablet Take 1 tablet (200 mg total) by mouth 2 (two) times daily. (Patient taking differently: Take 50 mg by mouth 2 (two) times daily.)   predniSONE (STERAPRED UNI-PAK 21 TAB) 10 MG (21) TBPK tablet Taper as directed on packaging   Fall Risk  01/09/2021  Falls in the past year? 0  Number falls in past yr: 0  Injury with Fall? 0  Risk for fall due to : No Fall Risks  Follow up Falls evaluation completed    Depression screen [facility] 2/9 01/09/2021 05/02/2020  Decreased Interest 0 0  Down, Depressed, Hopeless 0 0  PHQ - 2 Score 0 0  Altered sleeping - 0  Tired, decreased energy - 0  Change in appetite - 0  Feeling bad or failure about yourself  - 0  Trouble concentrating - 0  Moving slowly or fidgety/restless - 0  Suicidal thoughts - 0  PHQ-9 Score - 0   EKG:  NSR.  ----------------------------------------------------------------------------------------------------------------------------------------------------------------------------------------------------------------- Physical Exam BP (!) 143/68 (BP Location: Left Arm, Patient Position: Sitting, Cuff Size: Large)   Pulse 63   Temp 98.4 F (36.9 C)   Ht 5\' 8"  (1.727 m)   Wt 228 lb (103.4 kg)   SpO2 95%   BMI 34.67 kg/m   Care Team: Luetta Nutting, Farwell Cay Schillings, MD-nephrology   Cognitive Screening:   Mini-Cog - 01/12/21 2211     Normal clock drawing test? yes    How many words correct? 3            Functional  Status Survey: Is the patient deaf or have difficulty hearing?: Yes Does the patient have difficulty seeing, even when wearing glasses/contacts?: No Does the patient have difficulty concentrating, remembering, or making decisions?: No Does the patient have difficulty walking or climbing stairs?: No Does the patient have difficulty dressing or bathing?: No Does the patient have difficulty doing errands alone such as visiting a doctor's office or shopping?: No   ------------------------------------------------------------------------------------------------------------------------------------------------------------------------------------------------------------------ Assessment and Plan  Counseling provided on upcoming health maintenance items as listed below:  Health Maintenance Due  Topic Date Due   HIV Screening  Never done   COLONOSCOPY (Pts 45-33yrs Insurance coverage will need to be confirmed)  Never done   Zoster Vaccines- Shingrix (1 of 2) Never done   PNA vac Low Risk Adult (1 of 2 - PCV13) 08/16/2020   TETANUS/TDAP  10/19/2020   Orders Placed This Encounter  Procedures   DG Lumbar Spine Complete    Standing Status:   Future    Number of Occurrences:   1    Standing Expiration Date:   01/09/2022    Order Specific Question:   Reason for Exam (SYMPTOM  OR DIAGNOSIS REQUIRED)    Answer:   low back pain    Order Specific Question:   Preferred imaging location?    Answer:   [facility]   Pneumococcal conjugate vaccine 20-valent (Prevnar 20)   Lipid Panel w/reflex Direct LDL   COMPLETE METABOLIC PANEL WITH GFR   CBC with Differential   Hepatitis C Antibody   EKG 12-Lead     Advanced Directives Reviewed and/or information given

## 2021-01-12 NOTE — Assessment & Plan Note (Signed)
Blood pressure is at goal at for age and co-morbidities.  I recommend continuation of current medication.  In addition they were instructed to follow a low sodium diet with regular exercise to help to maintain adequate control of blood pressure.

## 2021-01-20 ENCOUNTER — Ambulatory Visit (INDEPENDENT_AMBULATORY_CARE_PROVIDER_SITE_OTHER): Payer: Medicare Other | Admitting: Family Medicine

## 2021-01-20 ENCOUNTER — Encounter: Payer: Self-pay | Admitting: Family Medicine

## 2021-01-20 ENCOUNTER — Other Ambulatory Visit: Payer: Self-pay

## 2021-01-20 DIAGNOSIS — M778 Other enthesopathies, not elsewhere classified: Secondary | ICD-10-CM | POA: Insufficient documentation

## 2021-01-20 DIAGNOSIS — M545 Low back pain, unspecified: Secondary | ICD-10-CM | POA: Diagnosis not present

## 2021-01-20 MED ORDER — CYCLOBENZAPRINE HCL 10 MG PO TABS: 10.0000 mg | ORAL_TABLET | Freq: Three times a day (TID) | ORAL | 0 refills | Status: DC | PRN

## 2021-01-20 MED ORDER — PREDNISONE 10 MG (48) PO TBPK: ORAL_TABLET | ORAL | 0 refills | Status: DC

## 2021-01-20 NOTE — Assessment & Plan Note (Signed)
Recommend using wrist brace to immobilize for a few days.  Steroid taper  If not improving with this we discussed having him f/u with Dr. Dianah Field.

## 2021-01-20 NOTE — Patient Instructions (Addendum)
   Try wrist splint for 4-5 days as needed

## 2021-01-20 NOTE — Progress Notes (Signed)
Kevin Li - 65 y.o. male MRN [id number]  Date of birth: 03-09-1956  Subjective Chief Complaint  Patient presents with   Back Pain   Wrist Pain   Tick Removal    HPI Kevin Li is a 65 y.o. male .here today with complaint of wrist pain.  He also has recurrence of back pain.  He recently completed steroid course for low back pain which had resolved but flared back up yesterday.  Pain is non radicular.  Movement makes this worse and he has some spasm associated with this.    Wrist pain is located on flexor side of R wrist and worse with resisted finger and wrist flexion.  He does not recall any injury or overuse.  He has not had swelling of the wrist.   ROS:  A comprehensive ROS was completed and negative except as noted per HPI  Allergies  Allergen Reactions   Statins Other (See Comments)    Myalgias    Codeine Nausea Only    Past Medical History:  Diagnosis Date   High cholesterol    Hypertension     Past Surgical History:  Procedure Laterality Date   HERNIA REPAIR     Age 63   VASECTOMY      Social History   Socioeconomic History   Marital status: Married    Spouse name: Not on file   Number of children: Not on file   Years of education: Not on file   Highest education level: Not on file  Occupational History   Not on file  Tobacco Use   Smoking status: Former    Pack years: 0.00    Types: Cigarettes    Quit date: 07/27/1989    Years since quitting: 31.5   Smokeless tobacco: Never  Vaping Use   Vaping Use: Never used  Substance and Sexual Activity   Alcohol use: Not on file   Drug use: Never   Sexual activity: Yes    Partners: Female  Other Topics Concern   Not on file  Social History Narrative   Not on file   Social Determinants of Health   Financial Resource Strain: Not on file  Food Insecurity: Not on file  Transportation Needs: Not on file  Physical Activity: Not on file  Stress: Not on file  Social Connections: Not on file    History  reviewed. No pertinent family history.  Health Maintenance  Topic Date Due   HIV Screening  Never done   COLONOSCOPY (Pts 45-3yrs Insurance coverage will need to be confirmed)  Never done   Zoster Vaccines- Shingrix (1 of 2) Never done   PNA vac Low Risk Adult (1 of 2 - PCV13) 08/16/2020   TETANUS/TDAP  10/19/2020   COVID-19 Vaccine (1) 05/18/2021 (Originally 08/16/1960)   INFLUENZA VACCINE  02/24/2021   Hepatitis C Screening  Completed   HPV VACCINES  Aged Out     ----------------------------------------------------------------------------------------------------------------------------------------------------------------------------------------------------------------- Physical Exam BP (!) 142/74 (BP Location: Left Arm, Patient Position: Sitting, Cuff Size: Large)   Pulse 74   Temp 98.1 F (36.7 C)   Ht 5\' 8"  (1.727 m)   Wt 224 lb (101.6 kg)   SpO2 94%   BMI 34.06 kg/m   Physical Exam Constitutional:      Appearance: Normal appearance.  Eyes:     General: No scleral icterus. Cardiovascular:     Rate and Rhythm: Normal rate and regular rhythm.  Pulmonary:     Effort: Pulmonary effort is normal.  Breath sounds: Normal breath sounds.  Musculoskeletal:     Cervical back: Neck supple.     Comments: ROM of R wrist is normal. TTP along flexors of R wrist and pain with resisted flexion of wrist and fingers.    Neurological:     Mental Status: He is alert.  Psychiatric:        Mood and Affect: Mood normal.        Behavior: Behavior normal.    ------------------------------------------------------------------------------------------------------------------------------------------------------------------------------------------------------------------- Assessment and Plan  Low back pain Adding longer taper of steroid and flexeril.  Given handout for home exercises.  Declines referral to PT for now.  He will let me know if this is isn't improving.  Right wrist  tendonitis Recommend using wrist brace to immobilize for a few days.  Steroid taper  If not improving with this we discussed having him f/u with Dr. Dianah Field.   Meds ordered this encounter  Medications   predniSONE (STERAPRED UNI-PAK 48 TAB) 10 MG (48) TBPK tablet    Sig: Taper as directed on packaging    Dispense:  48 tablet    Refill:  0   cyclobenzaprine (FLEXERIL) 10 MG tablet    Sig: Take 1 tablet (10 mg total) by mouth 3 (three) times daily as needed for muscle spasms.    Dispense:  30 tablet    Refill:  0    No follow-ups on file.    This visit occurred during the SARS-CoV-2 public health emergency.  Safety protocols were in place, including screening questions prior to the visit, additional usage of staff PPE, and extensive cleaning of exam room while observing appropriate contact time as indicated for disinfecting solutions.

## 2021-01-20 NOTE — Assessment & Plan Note (Signed)
Adding longer taper of steroid and flexeril.  Given handout for home exercises.  Declines referral to PT for now.  He will let me know if this is isn't improving.

## 2021-03-12 ENCOUNTER — Other Ambulatory Visit: Payer: Self-pay | Admitting: Family Medicine

## 2021-08-26 ENCOUNTER — Telehealth: Payer: Self-pay | Admitting: Family Medicine

## 2021-08-26 MED ORDER — EZETIMIBE 10 MG PO TABS: 10.0000 mg | ORAL_TABLET | Freq: Every day | ORAL | 3 refills | Status: DC

## 2021-08-26 NOTE — Telephone Encounter (Signed)
Pt notified of information below

## 2021-08-26 NOTE — Telephone Encounter (Signed)
Patient came in office to give [place] his new insurance card & said he needs a refill on medication below.   ezetimibe (ZETIA) 10 MG tablet

## 2021-08-29 ENCOUNTER — Other Ambulatory Visit: Payer: Self-pay

## 2021-08-29 MED ORDER — EZETIMIBE 10 MG PO TABS: 10.0000 mg | ORAL_TABLET | Freq: Every day | ORAL | 3 refills | Status: DC

## 2021-09-11 ENCOUNTER — Encounter: Payer: Self-pay | Admitting: Family Medicine

## 2021-09-11 ENCOUNTER — Emergency Department
Admission: EM | Admit: 2021-09-11 | Discharge: 2021-09-11 | Disposition: A | Discharge status: Home / Self Care | Payer: Medicare Other | Source: Home / Self Care

## 2021-09-11 ENCOUNTER — Other Ambulatory Visit: Payer: Self-pay

## 2021-09-11 DIAGNOSIS — Z791 Long term (current) use of non-steroidal anti-inflammatories (NSAID): Secondary | ICD-10-CM | POA: Diagnosis not present

## 2021-09-11 DIAGNOSIS — R079 Chest pain, unspecified: Secondary | ICD-10-CM | POA: Diagnosis not present

## 2021-09-11 DIAGNOSIS — R748 Abnormal levels of other serum enzymes: Secondary | ICD-10-CM | POA: Diagnosis not present

## 2021-09-11 DIAGNOSIS — I491 Atrial premature depolarization: Secondary | ICD-10-CM | POA: Diagnosis not present

## 2021-09-11 DIAGNOSIS — M25512 Pain in left shoulder: Secondary | ICD-10-CM | POA: Diagnosis not present

## 2021-09-11 DIAGNOSIS — R9431 Abnormal electrocardiogram [ECG] [EKG]: Secondary | ICD-10-CM

## 2021-09-11 DIAGNOSIS — R42 Dizziness and giddiness: Secondary | ICD-10-CM | POA: Diagnosis not present

## 2021-09-11 DIAGNOSIS — R55 Syncope and collapse: Secondary | ICD-10-CM | POA: Diagnosis not present

## 2021-09-11 DIAGNOSIS — Z79899 Other long term (current) drug therapy: Secondary | ICD-10-CM | POA: Diagnosis not present

## 2021-09-11 DIAGNOSIS — Z888 Allergy status to other drugs, medicaments and biological substances status: Secondary | ICD-10-CM | POA: Diagnosis not present

## 2021-09-11 DIAGNOSIS — Z7982 Long term (current) use of aspirin: Secondary | ICD-10-CM | POA: Diagnosis not present

## 2021-09-11 DIAGNOSIS — R5383 Other fatigue: Secondary | ICD-10-CM | POA: Diagnosis not present

## 2021-09-11 DIAGNOSIS — I10 Essential (primary) hypertension: Secondary | ICD-10-CM | POA: Diagnosis not present

## 2021-09-11 MED ORDER — ASPIRIN 325 MG PO TABS
325.0000 mg | ORAL_TABLET | Freq: Every day | ORAL | Status: DC
  Administered 2021-09-11: 325 mg via ORAL

## 2021-09-11 NOTE — ED Provider Notes (Signed)
[name]    CSN: [id number] Arrival date & time: 09/11/21  1844      History   Chief Complaint Chief Complaint  Patient presents with   Shoulder Pain    HPI Kevin Li is a 66 y.o. male.   HPI 66 year old male presents with left shoulder discomfort for 2 months.  Patient reports left shoulder pain radiated to left arm pain and was accompanied by a feeling of dizziness and lightheadedness earlier today.  Patient also reports that he is extremely weak and slightly shortness of breath.  Denies chest pain and nausea. PMH significant for HTN and low back pain.  Past Medical History:  Diagnosis Date   High cholesterol    Hypertension     Patient Active Problem List   Diagnosis Date Noted   Right wrist tendonitis 01/20/2021   Low back pain 01/12/2021   Essential hypertension 05/02/2020   HLD (hyperlipidemia) 05/02/2020   Bilateral plantar fasciitis 05/02/2020    Past Surgical History:  Procedure Laterality Date   HERNIA REPAIR     Age 17   VASECTOMY         Home Medications    Prior to Admission medications   Medication Sig Start Date End Date Taking? Authorizing Provider  aspirin 81 MG chewable tablet Chew by mouth daily.   Yes [provider]  amLODipine (NORVASC) 2.5 MG tablet  10/30/20   [provider]  cyclobenzaprine (FLEXERIL) 10 MG tablet Take 1 tablet (10 mg total) by mouth 3 (three) times daily as needed for muscle spasms. 01/20/21   Luetta Nutting, DO  ezetimibe (ZETIA) 10 MG tablet Take 1 tablet (10 mg total) by mouth daily. 08/29/21   Luetta Nutting, DO  labetalol (NORMODYNE) 200 MG tablet TAKE 1 TABLET TWICE A DAY 03/14/21   Luetta Nutting, DO  predniSONE (STERAPRED UNI-PAK 48 TAB) 10 MG (48) TBPK tablet Taper as directed on packaging 01/20/21   Luetta Nutting, DO    Family History Family History  Problem Relation Age of Onset   Hypertension Mother    Heart attack Father     Social History Social History   Tobacco  Use   Smoking status: Former    Types: Cigarettes    Quit date: 07/27/1989    Years since quitting: 32.1   Smokeless tobacco: Never  Vaping Use   Vaping Use: Never used  Substance Use Topics   Drug use: Never     Allergies   Statins and Codeine   Review of Systems Review of Systems  Musculoskeletal:        Left shoulder pain/left upper arm pain x 2 months worsening over the past 2 days.  Neurological:  Positive for dizziness and light-headedness.    Physical Exam Triage Vital Signs ED Triage Vitals  Enc Vitals Group     BP      Pulse      Resp      Temp      Temp src      SpO2      Weight      Height      Head Circumference      Peak Flow      Pain Score      Pain Loc      Pain Edu?      Excl. in [place]?    No data found.  Updated Vital Signs BP (!) 174/74 (BP Location: Right Arm)    Pulse 68    Temp  98.4 F (36.9 C) (Oral)    Resp 20    Ht 5\' 8"  (1.727 m)    Wt 240 lb (108.9 kg)    SpO2 96%    BMI 36.49 kg/m       Physical Exam Vitals and nursing note reviewed.  Constitutional:      Appearance: Normal appearance. He is normal weight.  HENT:     Head: Normocephalic and atraumatic.     Mouth/Throat:     Mouth: Mucous membranes are moist.     Pharynx: Oropharynx is clear.  Eyes:     Extraocular Movements: Extraocular movements intact.     Conjunctiva/sclera: Conjunctivae normal.     Pupils: Pupils are equal, round, and reactive to light.  Neck:     Vascular: No carotid bruit.     Comments: No JVD, No bruit Cardiovascular:     Rate and Rhythm: Normal rate and regular rhythm.     Pulses: Normal pulses.     Heart sounds: Normal heart sounds. No murmur heard.   No friction rub. No gallop.     Comments: Hypertensive-initially at triage 204/92, during EKG left arm BP while supine 195/105, Pulmonary:     Effort: Pulmonary effort is normal.     Breath sounds: Normal breath sounds.  Musculoskeletal:     Cervical back: Normal range of motion and neck  supple.     Right lower leg: No edema.     Left lower leg: No edema.  Skin:    General: Skin is warm and dry.  Neurological:     General: No focal deficit present.     Mental Status: He is alert and oriented to person, place, and time. Mental status is at baseline.     UC Treatments / Results  Labs (all labs ordered are listed, but only abnormal results are displayed) Labs Reviewed - No data to display  EKG   Radiology No results found.  Procedures Procedures (including critical care time)  Medications Ordered in UC Medications  aspirin tablet 325 mg (325 mg Oral Given 09/11/21 1910)    Initial Impression / Assessment and Plan / UC Course  I have reviewed the triage vital signs and the nursing notes.  Pertinent labs & imaging results that were available during my care of the patient were reviewed by me and considered in my medical decision making (see chart for details).     MDM: 1.  Abnormal EKG-EKG reveals sinus rhythm with occasional and consecutive PVCs; 2. Acute pain of left shoulder-suspect unstable angina; 3.  Hypertensive-left arm lying supine 195/105.  IV access established prior to EMS arrival this evening.  Patient transported via EMS to local ED [facility]) at 7:32 PM. Final Clinical Impressions(s) / UC Diagnoses   Final diagnoses:  Acute pain of left shoulder  Nonspecific abnormal electrocardiogram (ECG) (EKG)  Lightheadedness     Discharge Instructions      EMS called at 7:07 PM for this patient due to abnormal EKG accompanied by elevated blood pressure, suspect unstable angina.  EKG reveals sinus rhythm with occasional and consecutive PVCs.  Abnormal EKG.     ED Prescriptions   None    PDMP not reviewed this encounter.   Eliezer Lofts, [place] 09/11/21 1945

## 2021-09-11 NOTE — ED Notes (Signed)
325 mg Aspirin given at approx 0710

## 2021-09-11 NOTE — Discharge Instructions (Addendum)
EMS called at 7:07 PM for this patient due to abnormal EKG accompanied by elevated blood pressure, suspect unstable angina.  EKG reveals sinus rhythm with occasional and consecutive PVCs.  Abnormal EKG.

## 2021-09-11 NOTE — ED Notes (Signed)
Kathi Ludwig, FNP in room.

## 2021-09-11 NOTE — ED Notes (Signed)
EMS here. Pt c/o L jaw pain and shoulder pain (L).

## 2021-09-11 NOTE — ED Triage Notes (Signed)
Pt presents to Urgent Care with c/o onset of L shoulder and arm pain today after feeling dizzy/lightheaded. Also states he feels extremely weak and slightly sob. Denies chest pain and nausea.

## 2021-09-11 NOTE — ED Notes (Signed)
Patient is being discharged from the Urgent Care and sent to the Emergency Department via EMS. Per Eliezer Lofts, NP patient is in need of higher level of care due to unstable angina, HTN and abnormal EKG. Patient is aware and verbalizes understanding of plan of care.  Vitals:   09/11/21 1855  BP: (!) 199/92

## 2021-09-12 ENCOUNTER — Telehealth: Payer: Self-pay | Admitting: General Practice

## 2021-09-12 NOTE — Telephone Encounter (Signed)
Transition Care Management Follow-up Telephone Call Date of discharge and from where: 09/11/21 from [facility] How have you been since you were released from the hospital? Doing better today. Any questions or concerns? No  Items Reviewed: Did the pt receive and understand the discharge instructions provided? Yes  Medications obtained and verified? No  Other? No  Any new allergies since your discharge? No  Dietary orders reviewed? Yes Do you have support at home? Yes   Home Care and Equipment/Supplies: Were home health services ordered? no  Functional Questionnaire: (I = Independent and D = Dependent) ADLs: I  Bathing/Dressing- I  Meal Prep- I  Eating- I  Maintaining continence- I  Transferring/Ambulation- I  Managing Meds- I  Follow up appointments reviewed:  PCP Hospital f/u appt confirmed? Yes  Scheduled to see Dr. Zigmund Daniel on 09/22/21 @ 1110. [facility] f/u appt confirmed? No   Are transportation arrangements needed? No  If their condition worsens, is the pt aware to call PCP or go to the Emergency Dept.? Yes Was the patient provided with contact information for the PCP's office or ED? Yes Was to pt encouraged to call back with questions or concerns? Yes

## 2021-09-19 DIAGNOSIS — Z20822 Contact with and (suspected) exposure to covid-19: Secondary | ICD-10-CM | POA: Diagnosis not present

## 2021-09-19 DIAGNOSIS — N183 Chronic kidney disease, stage 3 unspecified: Secondary | ICD-10-CM | POA: Diagnosis not present

## 2021-09-19 DIAGNOSIS — K219 Gastro-esophageal reflux disease without esophagitis: Secondary | ICD-10-CM | POA: Diagnosis not present

## 2021-09-19 DIAGNOSIS — I129 Hypertensive chronic kidney disease with stage 1 through stage 4 chronic kidney disease, or unspecified chronic kidney disease: Secondary | ICD-10-CM | POA: Diagnosis not present

## 2021-09-19 DIAGNOSIS — K2100 Gastro-esophageal reflux disease with esophagitis, without bleeding: Secondary | ICD-10-CM | POA: Diagnosis not present

## 2021-09-19 DIAGNOSIS — Z7982 Long term (current) use of aspirin: Secondary | ICD-10-CM | POA: Diagnosis not present

## 2021-09-19 DIAGNOSIS — Z79899 Other long term (current) drug therapy: Secondary | ICD-10-CM | POA: Diagnosis not present

## 2021-09-19 DIAGNOSIS — Z888 Allergy status to other drugs, medicaments and biological substances status: Secondary | ICD-10-CM | POA: Diagnosis not present

## 2021-09-19 DIAGNOSIS — R0602 Shortness of breath: Secondary | ICD-10-CM | POA: Diagnosis not present

## 2021-09-19 DIAGNOSIS — R0600 Dyspnea, unspecified: Secondary | ICD-10-CM | POA: Diagnosis not present

## 2021-09-19 DIAGNOSIS — E785 Hyperlipidemia, unspecified: Secondary | ICD-10-CM | POA: Diagnosis not present

## 2021-09-19 DIAGNOSIS — R002 Palpitations: Secondary | ICD-10-CM | POA: Diagnosis not present

## 2021-09-19 DIAGNOSIS — Z791 Long term (current) use of non-steroidal anti-inflammatories (NSAID): Secondary | ICD-10-CM | POA: Diagnosis not present

## 2021-09-22 ENCOUNTER — Encounter: Payer: Self-pay | Admitting: Family Medicine

## 2021-09-22 ENCOUNTER — Ambulatory Visit (INDEPENDENT_AMBULATORY_CARE_PROVIDER_SITE_OTHER): Payer: Medicare Other | Admitting: Family Medicine

## 2021-09-22 ENCOUNTER — Other Ambulatory Visit: Payer: Self-pay

## 2021-09-22 ENCOUNTER — Telehealth: Payer: Self-pay | Admitting: General Practice

## 2021-09-22 DIAGNOSIS — K219 Gastro-esophageal reflux disease without esophagitis: Secondary | ICD-10-CM

## 2021-09-22 DIAGNOSIS — I10 Essential (primary) hypertension: Secondary | ICD-10-CM | POA: Diagnosis not present

## 2021-09-22 NOTE — Patient Instructions (Addendum)
Continue the prilosec at current strength.  Let me know if symptoms return.  Let's follow up in 6 months or sooner if needed.

## 2021-09-22 NOTE — Progress Notes (Signed)
Kevin Li - 66 y.o. male MRN [id number]  Date of birth: Dec 07, 1955  Subjective No chief complaint on file.   HPI Kevin Li is a 65 year old male here today for hospital follow-up.  Patient seen in urgent care for chest pain and was recommended to seek emergency care to rule out ACS.  He did have radiation of pain into his left jaw and down the arm.  Evaluation in the ER without EKG changes and negative cardiac enzymes.  Initially discharged in the ER but returned a few days later with similar symptoms.  Ruled out for ACS once again.  Started on omeprazole and he reports that within a couple of days his symptoms including burning sensation in his chest had completely resolved.  He has not had any further symptoms since starting this.  ROS:  A comprehensive ROS was completed and negative except as noted per HPI  Allergies  Allergen Reactions   Statins Other (See Comments)    Myalgias    Codeine Nausea Only    Past Medical History:  Diagnosis Date   High cholesterol    Hypertension     Past Surgical History:  Procedure Laterality Date   HERNIA REPAIR     Age 72   VASECTOMY      Social History   Socioeconomic History   Marital status: Married    Spouse name: Not on file   Number of children: Not on file   Years of education: Not on file   Highest education level: Not on file  Occupational History   Not on file  Tobacco Use   Smoking status: Former    Types: Cigarettes    Quit date: 07/27/1989    Years since quitting: 32.1   Smokeless tobacco: Never  Vaping Use   Vaping Use: Never used  Substance and Sexual Activity   Alcohol use: Not on file   Drug use: Never   Sexual activity: Yes    Partners: Female  Other Topics Concern   Not on file  Social History Narrative   Not on file   Social Determinants of Health   Financial Resource Strain: Not on file  Food Insecurity: Not on file  Transportation Needs: Not on file  Physical Activity: Not on file  Stress: Not on  file  Social Connections: Not on file    Family History  Problem Relation Age of Onset   Hypertension Mother    Heart attack Father     Health Maintenance  Topic Date Due   COVID-19 Vaccine (1) Never done   Zoster Vaccines- Shingrix (1 of 2) Never done   COLONOSCOPY (Pts 45-51yrs Insurance coverage will need to be confirmed)  Never done   TETANUS/TDAP  10/19/2020   INFLUENZA VACCINE  02/24/2021   Pneumonia Vaccine 33+ Years old  Completed   Hepatitis C Screening  Completed   HPV VACCINES  Aged Out     ----------------------------------------------------------------------------------------------------------------------------------------------------------------------------------------------------------------- Physical Exam BP (!) 155/77    Pulse (!) 56    Wt 245 lb (111.1 kg)    SpO2 98%    BMI 37.25 kg/m   Physical Exam Constitutional:      Appearance: Normal appearance.  Eyes:     General: No scleral icterus. Cardiovascular:     Rate and Rhythm: Normal rate and regular rhythm.  Pulmonary:     Effort: Pulmonary effort is normal.     Breath sounds: Normal breath sounds.  Abdominal:     General: There is no distension.  Tenderness: There is no abdominal tenderness.  Musculoskeletal:     Cervical back: Neck supple.  Neurological:     Mental Status: He is alert.  Psychiatric:        Mood and Affect: Mood normal.        Behavior: Behavior normal.    ------------------------------------------------------------------------------------------------------------------------------------------------------------------------------------------------------------------- Assessment and Plan  Essential hypertension Blood pressure is elevated today.  Her readings at home have been much better controlled with readings of 120-130/75-80.  He will continue to monitor this at home.  GERD (gastroesophageal reflux disease) Chest pain related to GERD.  Ruled out for ACS x2.  Recommend  continuation of omeprazole at current strength.  He will let me know if symptoms return or worsen.   No orders of the defined types were placed in this encounter.   Return in about 6 months (around 03/22/2022) for HTN/FBW.    This visit occurred during the SARS-CoV-2 public health emergency.  Safety protocols were in place, including screening questions prior to the visit, additional usage of staff PPE, and extensive cleaning of exam room while observing appropriate contact time as indicated for disinfecting solutions.

## 2021-09-22 NOTE — Telephone Encounter (Signed)
Transition Care Management Follow-up Telephone Call Date of discharge and from where: 09/19/21 from [facility] How have you been since you were released from the hospital? Patient had an OV with PCP today. Any questions or concerns? No

## 2021-09-22 NOTE — Assessment & Plan Note (Signed)
Chest pain related to GERD.  Ruled out for ACS x2.  Recommend continuation of omeprazole at current strength.  He will let me know if symptoms return or worsen.

## 2021-09-22 NOTE — Assessment & Plan Note (Signed)
Blood pressure is elevated today.  Her readings at home have been much better controlled with readings of 120-130/75-80.  He will continue to monitor this at home.

## 2021-09-27 ENCOUNTER — Encounter: Payer: Self-pay | Admitting: Family Medicine

## 2021-09-29 MED ORDER — OMEPRAZOLE 20 MG PO CPDR
20.0000 mg | DELAYED_RELEASE_CAPSULE | Freq: Two times a day (BID) | ORAL | 1 refills | Status: DC
Start: 2021-09-29 — End: 2021-10-14

## 2021-10-14 ENCOUNTER — Encounter: Payer: Self-pay | Admitting: Family Medicine

## 2021-10-14 ENCOUNTER — Other Ambulatory Visit: Payer: Self-pay

## 2021-10-14 ENCOUNTER — Ambulatory Visit (INDEPENDENT_AMBULATORY_CARE_PROVIDER_SITE_OTHER): Payer: Medicare Other | Admitting: Family Medicine

## 2021-10-14 DIAGNOSIS — S39011A Strain of muscle, fascia and tendon of abdomen, initial encounter: Secondary | ICD-10-CM | POA: Insufficient documentation

## 2021-10-14 MED ORDER — OMEPRAZOLE 20 MG PO CPDR: 20.0000 mg | DELAYED_RELEASE_CAPSULE | Freq: Two times a day (BID) | ORAL | 1 refills | Status: DC

## 2021-10-14 NOTE — Patient Instructions (Signed)
Muscle Strain ?A muscle strain is an injury that occurs when a muscle is stretched beyond its normal length. Usually, a small number of muscle fibers are torn when this happens. There are three types of muscle strains. First-degree strains have the least amount of muscle fiber tearing and the least amount of pain. Second-degree and third-degree strains have more tearing and pain. ?Usually, recovery from muscle strain takes 1-2 weeks. Complete healing normally takes 5-6 weeks. ?What are the causes? ?This condition is caused when a sudden, violent force is placed on a muscle and stretches it too far. This may occur with a fall, while lifting, or during sports. ?What increases the risk? ?This condition is more likely to develop in athletes and people who are physically active. ?What are the signs or symptoms? ?Symptoms of this condition include: ?Pain. ?Tenderness. ?Bruising. ?Swelling. ?Trouble using the muscle. ?How is this diagnosed? ?This condition is diagnosed based on a physical exam and your medical history. Tests may also be done, including an X-ray, ultrasound, or MRI. ?How is this treated? ?This condition is initially treated with PRICE therapy. This therapy involves: ?Protecting the muscle from being injured again. ?Resting the injured muscle. ?Icing the injured muscle. ?Applying pressure (compression) to the injured muscle. This may be done with a splint or elastic bandage. ?Raising (elevating) the injured muscle. ?Your health care provider may also recommend medicine for pain. ?Follow these instructions at home: ?If you have a removable splint: ?Wear the splint as told by your health care provider. Remove it only as told by your health care provider. ?Check the skin around the splint every day. Tell your health care provider about any concerns. ?Loosen the splint if your fingers or toes tingle, become numb, or turn cold and blue. ?Keep the splint clean. ?If the splint is not waterproof: ?Do not let it get  wet. ?Cover it with a watertight covering when you take a bath or a shower. ?Managing pain, stiffness, and swelling ? ?If directed, put ice on the injured area. To do this: ?If you have a removable splint, remove it as told by your health care provider. ?Put ice in a plastic bag. ?Place a towel between your skin and the bag. ?Leave the ice on for 20 minutes, 2-3 times a day. ?Remove the ice if your skin turns bright red. This is very important. If you cannot feel pain, heat, or cold, you have a greater risk of damage to the area. ?Move your fingers or toes often to reduce stiffness and swelling. ?Raise (elevate) the injured area above the level of your heart while you are sitting or lying down. ?Wear an elastic bandage as told by your health care provider. Make sure that it is not too tight. ?General instructions ?Take over-the-counter and prescription medicines only as told by your health care provider. Treatment may include muscle relaxants or medicines for pain and inflammation that are taken by mouth or applied to the skin. ?Restrict your activity and rest the injured muscle as told by your health care provider. Gentle movements may be allowed. ?If physical therapy was prescribed, do exercises as told by your health care provider. ?Do not put pressure on any part of the splint until it is fully hardened. This may take several hours. ?Do not use any products that contain nicotine or tobacco. These products include cigarettes, chewing tobacco, and vaping devices, such as e-cigarettes. If you need help quitting, ask your health care provider. ?Ask your health care provider when it is  safe to drive if you have a splint. ?Keep all follow-up visits. This is important. ?How is this prevented? ?Warm up before exercising. This helps to prevent future muscle strains. ?Contact a health care provider if: ?You have more pain or swelling in the injured area. ?Get help right away if: ?You have numbness or tingling in the  injured area. ?You lose a lot of strength in the injured area. ?Summary ?A muscle strain is an injury that occurs when a muscle is stretched beyond its normal length. ?This condition is caused when a sudden, violent force is placed on a muscle and stretches it too far. ?This condition is initially treated with PRICE therapy, which involves protecting, resting, icing, compressing, and elevating. ?Gentle movements may be allowed. If physical therapy was prescribed, do exercises as told by your health care provider. ?This information is not intended to replace advice given to you by your health care provider. Make sure you discuss any questions you have with your health care provider. ?Document Revised: 09/30/2020 Document Reviewed: 09/30/2020 ?Elsevier Patient Education ? [place]. ? ?

## 2021-10-14 NOTE — Progress Notes (Signed)
?Kevin Li - 66 y.o. male MRN [id number]  Date of birth: 03-Aug-1955 ? ?Subjective ?Chief Complaint  ?Patient presents with  ? Abdominal Pain  ? ? ?HPI ?Is a 66 year old male here today for complaint of abdominal pain.  Reports that he recently started doing more abdominal workouts to help with toning his abdomen and core.  Pain began a few days after starting this.  Pain is sharp in nature.  Only lasted for about a day and has been improving since then.  He denies any changes to his bowels, appetite, nausea or vomiting.  He has not had any fevers. ? ?ROS:  A comprehensive ROS was completed and negative except as noted per HPI ? ?Allergies  ?Allergen Reactions  ? Statins Other (See Comments)  ?  Myalgias ?  ? Codeine Nausea Only  ? ? ?Past Medical History:  ?Diagnosis Date  ? High cholesterol   ? Hypertension   ? ? ?Past Surgical History:  ?Procedure Laterality Date  ? HERNIA REPAIR    ? Age 75  ? VASECTOMY    ? ? ?Social History  ? ?Socioeconomic History  ? Marital status: Married  ?  Spouse name: Not on file  ? Number of children: Not on file  ? Years of education: Not on file  ? Highest education level: Not on file  ?Occupational History  ? Not on file  ?Tobacco Use  ? Smoking status: Former  ?  Types: Cigarettes  ?  Quit date: 07/27/1989  ?  Years since quitting: 32.2  ? Smokeless tobacco: Never  ?Vaping Use  ? Vaping Use: Never used  ?Substance and Sexual Activity  ? Alcohol use: Not on file  ? Drug use: Never  ? Sexual activity: Yes  ?  Partners: Female  ?Other Topics Concern  ? Not on file  ?Social History Narrative  ? Not on file  ? ?Social Determinants of Health  ? ?Financial Resource Strain: Not on file  ?Food Insecurity: Not on file  ?Transportation Needs: Not on file  ?Physical Activity: Not on file  ?Stress: Not on file  ?Social Connections: Not on file  ? ? ?Family History  ?Problem Relation Age of Onset  ? Hypertension Mother   ? Heart attack Father   ? ? ?Health Maintenance  ?Topic Date Due  ? INFLUENZA  VACCINE  04/26/2022 (Originally 02/24/2021)  ? Zoster Vaccines- Shingrix (1 of 2) 04/26/2022 (Originally 08/16/1974)  ? COLONOSCOPY (Pts 45-51yr Insurance coverage will need to be confirmed)  10/15/2022 (Originally 08/16/2000)  ? TETANUS/TDAP  10/15/2022 (Originally 10/19/2020)  ? Pneumonia Vaccine 66 Years old  Completed  ? Hepatitis C Screening  Completed  ? HPV VACCINES  Aged Out  ? COVID-19 Vaccine  Discontinued  ? ? ? ?----------------------------------------------------------------------------------------------------------------------------------------------------------------------------------------------------------------- ?Physical Exam ?BP 136/74 (BP Location: Right Arm, Patient Position: Sitting, Cuff Size: Large)   Pulse (!) 52   Ht '5\' 8"'$  (1.727 m)   Wt 247 lb (112 kg)   SpO2 98%   BMI 37.56 kg/m?  ? ?Physical Exam ?Constitutional:   ?   Appearance: He is well-developed.  ?Eyes:  ?   General: No scleral icterus. ?Cardiovascular:  ?   Rate and Rhythm: Normal rate and regular rhythm.  ?Pulmonary:  ?   Effort: Pulmonary effort is normal.  ?   Breath sounds: Normal breath sounds.  ?Musculoskeletal:  ?   Cervical back: Neck supple.  ?Neurological:  ?   General: No focal deficit present.  ?   Mental Status:  He is alert.  ?Psychiatric:     ?   Mood and Affect: Mood normal.     ?   Behavior: Behavior normal.  ? ? ?------------------------------------------------------------------------------------------------------------------------------------------------------------------------------------------------------------------- ?Assessment and Plan ? ?Abdominal muscle strain ?Symptoms have improved over the past few days.  He will avoid abdominal workouts for now.  I expect this to continue to improve.  He will let me know if symptoms worsen. ? ? ?Meds ordered this encounter  ?Medications  ? omeprazole (PRILOSEC) 20 MG capsule  ?  Sig: Take 1 capsule (20 mg total) by mouth 2 (two) times daily before a meal.  ?   Dispense:  180 capsule  ?  Refill:  1  ? ? ?No follow-ups on file. ? ? ? ?This visit occurred during the SARS-CoV-2 public health emergency.  Safety protocols were in place, including screening questions prior to the visit, additional usage of staff PPE, and extensive cleaning of exam room while observing appropriate contact time as indicated for disinfecting solutions.  ? ?

## 2021-10-14 NOTE — Assessment & Plan Note (Signed)
Symptoms have improved over the past few days.  He will avoid abdominal workouts for now.  I expect this to continue to improve.  He will let me know if symptoms worsen. ?

## 2021-10-21 ENCOUNTER — Telehealth: Payer: Self-pay | Admitting: Family Medicine

## 2021-10-21 NOTE — Telephone Encounter (Signed)
Pt given appt for 3//30/2023 at [facility], CMA ?

## 2021-10-21 NOTE — Telephone Encounter (Signed)
Patient says he know believes he has a hernia. He is holding it in place by a tennis ball and an ace bandage. The only available appts are same day appts. Please advise if you want me to scheduled an appt.  ?

## 2021-10-21 NOTE — Telephone Encounter (Signed)
Ok to use same day appt

## 2021-10-23 ENCOUNTER — Encounter: Payer: Self-pay | Admitting: Family Medicine

## 2021-10-23 ENCOUNTER — Ambulatory Visit (INDEPENDENT_AMBULATORY_CARE_PROVIDER_SITE_OTHER): Payer: Medicare Other | Admitting: Family Medicine

## 2021-10-23 VITALS — BP 142/69 | HR 58 | Ht 68.0 in | Wt 242.0 lb

## 2021-10-23 DIAGNOSIS — K429 Umbilical hernia without obstruction or gangrene: Secondary | ICD-10-CM

## 2021-10-23 NOTE — Patient Instructions (Signed)
Umbilical Hernia, Adult °A hernia is a bulge of tissue that pushes through an opening between muscles. An umbilical hernia happens in the abdomen, near the belly button (umbilicus). The hernia may contain tissues from the small intestine, large intestine, or fatty tissue covering the intestines. Umbilical hernias in adults tend to get worse over time, and they require surgical treatment. °There are different types of umbilical hernias, including: °Indirect hernia. This type is located just above or below the umbilicus. It is the most common type of umbilical hernia in adults. °Direct hernia. This type forms through an opening formed by the umbilicus. °Reducible hernia. This type of hernia comes and goes. It may be visible only when you strain, lift something heavy, or cough. This type of hernia can be pushed back into the abdomen (reduced). °Incarcerated hernia. This type traps abdominal tissue inside the hernia. This type of hernia cannot be reduced. °Strangulated hernia. This type of hernia cuts off blood flow to the tissues inside the hernia. The tissues can start to die if this happens. This type of hernia requires emergency treatment. °What are the causes? °An umbilical hernia happens when tissue inside the abdomen presses on a weak area of the abdominal muscles. °What increases the risk? °You may have a greater risk of this condition if you: °Are obese. °Have had several pregnancies. °Have a buildup of fluid inside your abdomen. °Have had surgery that weakens the abdominal muscles. °What are the signs or symptoms? °The main symptom of this condition is a painless bulge at or near the belly button. °A reducible hernia may be visible only when you strain, lift something heavy, or cough. Other symptoms may include: °Dull pain. °A feeling of pressure. °Symptoms of a strangulated hernia may include: °Pain that gets increasingly worse. °Nausea and vomiting. °Pain when pressing on the hernia. °Skin over the hernia  becoming red or purple. °Constipation. °Blood in the stool. °How is this diagnosed? °This condition may be diagnosed based on: °A physical exam. You may be asked to cough or strain while standing. These actions increase the pressure inside your abdomen and can force the hernia through the opening in your muscles. Your health care provider may try to reduce the hernia by pressing on it. °Your symptoms and medical history. °How is this treated? °Surgery is the only treatment for an umbilical hernia. Surgery for a strangulated hernia is done as soon as possible. If you have a small hernia that is not incarcerated, you may need to lose weight before having surgery. °Follow these instructions at home: °Lose weight, if told by your health care provider. °Do not try to push the hernia back in. °Watch your hernia for any changes in color or size. Tell your health care provider if any changes occur. °You may need to avoid activities that increase pressure on your hernia. °Do not lift anything that is heavier than 10 lb (4.5 kg), or the limit that you are told, until your health care provider says that it is safe. °Take over-the-counter and prescription medicines only as told by your health care provider. °Keep all follow-up visits. This is important. °Contact a health care provider if: °Your hernia gets larger. °Your hernia becomes painful. °Get help right away if: °You develop sudden, severe pain near the area of your hernia. °You have pain as well as nausea or vomiting. °You have pain and the skin over your hernia changes color. °You develop a fever or chills. °Summary °A hernia is a bulge of tissue   that pushes through an opening between muscles. An umbilical hernia happens near the belly button. °Surgery is the only treatment for an umbilical hernia. °Do not try to push your hernia back in. °Keep all follow-up visits. This is important. °This information is not intended to replace advice given to you by your health care  provider. Make sure you discuss any questions you have with your health care provider. °Document Revised: 02/19/2020 Document Reviewed: 02/19/2020 °Elsevier Patient Education © 2022 [employer] ° °

## 2021-10-23 NOTE — Progress Notes (Signed)
? ?  Subjective:  ? ? Patient ID: Kevin Li, male    DOB: 25-Feb-1956, 66 y.o.   MRN: [id number] ? ?HPI ?Kevin Li is a 66 yo male presenting to the clinic today with concerns for an umbilical hernia.  ? ?He states he first noticed a painless bulge around his umbilical region about 6 months ago. He recently was using the rowing machine and noticed pain in this area. He has been using a tennis ball and wrap to hold the protuberance in place. He denies any current pain at this time, unless increasing abdominal pressure such as coughing. When that occurs, he feels a sharp deep pain. ? ? ?Review of Systems  ?Constitutional:  Negative for appetite change, chills, fever and unexpected weight change.  ?Gastrointestinal:  Positive for abdominal distention and abdominal pain. Negative for blood in stool, constipation, diarrhea and nausea.  ? ?   ?Objective:  ? Physical Exam ?Constitutional:   ?   Appearance: Normal appearance. He is normal weight. He is not toxic-appearing.  ?Abdominal:  ?   Tenderness: There is no abdominal tenderness. There is no guarding.  ?   Hernia: A hernia is present. Hernia is present in the umbilical area.  ?   Comments: Felt on top of the umbilical region. Nontender to palpation.  ?Neurological:  ?   Mental Status: He is alert.  ? ? ?   ?Assessment & Plan:  ?..Kevin Li was seen today for hernia. ? ?Diagnoses and all orders for this visit: ? ?Umbilical hernia without obstruction and without gangrene ?-     Ambulatory referral to General Surgery ? ?Likely umbilical hernia based off of patient's presenting symptoms and physical exam. Will refer to general surgery for surgical correction due pain elicited with valsalva. Patient is agreeable to this decision. Patient educated to not lift anything above 15-20 lbs at this time and to go to hospital upon noticing any red flag symptoms such as fever, blood in stool, or change in bowel habits.  ?

## 2021-11-05 DIAGNOSIS — K429 Umbilical hernia without obstruction or gangrene: Secondary | ICD-10-CM | POA: Diagnosis not present

## 2021-11-05 DIAGNOSIS — I10 Essential (primary) hypertension: Secondary | ICD-10-CM | POA: Diagnosis not present

## 2021-11-09 ENCOUNTER — Other Ambulatory Visit: Payer: Self-pay | Admitting: Family Medicine

## 2021-11-12 DIAGNOSIS — N200 Calculus of kidney: Secondary | ICD-10-CM | POA: Diagnosis not present

## 2021-11-12 DIAGNOSIS — I129 Hypertensive chronic kidney disease with stage 1 through stage 4 chronic kidney disease, or unspecified chronic kidney disease: Secondary | ICD-10-CM | POA: Diagnosis not present

## 2021-11-12 DIAGNOSIS — E785 Hyperlipidemia, unspecified: Secondary | ICD-10-CM | POA: Diagnosis not present

## 2021-11-12 DIAGNOSIS — I493 Ventricular premature depolarization: Secondary | ICD-10-CM | POA: Diagnosis not present

## 2021-11-12 DIAGNOSIS — Z5309 Procedure and treatment not carried out because of other contraindication: Secondary | ICD-10-CM | POA: Diagnosis not present

## 2021-11-12 DIAGNOSIS — N183 Chronic kidney disease, stage 3 unspecified: Secondary | ICD-10-CM | POA: Diagnosis not present

## 2021-11-12 DIAGNOSIS — K429 Umbilical hernia without obstruction or gangrene: Secondary | ICD-10-CM | POA: Diagnosis not present

## 2021-11-24 DIAGNOSIS — I10 Essential (primary) hypertension: Secondary | ICD-10-CM | POA: Diagnosis not present

## 2021-11-24 DIAGNOSIS — R072 Precordial pain: Secondary | ICD-10-CM | POA: Diagnosis not present

## 2021-11-24 DIAGNOSIS — I493 Ventricular premature depolarization: Secondary | ICD-10-CM | POA: Diagnosis not present

## 2021-12-15 DIAGNOSIS — R072 Precordial pain: Secondary | ICD-10-CM | POA: Diagnosis not present

## 2021-12-15 DIAGNOSIS — I493 Ventricular premature depolarization: Secondary | ICD-10-CM | POA: Diagnosis not present

## 2022-01-14 DIAGNOSIS — I10 Essential (primary) hypertension: Secondary | ICD-10-CM | POA: Diagnosis not present

## 2022-01-14 DIAGNOSIS — Z7982 Long term (current) use of aspirin: Secondary | ICD-10-CM | POA: Diagnosis not present

## 2022-01-14 DIAGNOSIS — Z888 Allergy status to other drugs, medicaments and biological substances status: Secondary | ICD-10-CM | POA: Diagnosis not present

## 2022-01-14 DIAGNOSIS — E785 Hyperlipidemia, unspecified: Secondary | ICD-10-CM | POA: Diagnosis not present

## 2022-01-14 DIAGNOSIS — K420 Umbilical hernia with obstruction, without gangrene: Secondary | ICD-10-CM | POA: Diagnosis not present

## 2022-01-14 DIAGNOSIS — Z79899 Other long term (current) drug therapy: Secondary | ICD-10-CM | POA: Diagnosis not present

## 2022-02-04 DIAGNOSIS — I10 Essential (primary) hypertension: Secondary | ICD-10-CM | POA: Diagnosis not present

## 2022-02-04 DIAGNOSIS — Z48815 Encounter for surgical aftercare following surgery on the digestive system: Secondary | ICD-10-CM | POA: Diagnosis not present

## 2022-02-04 DIAGNOSIS — Z9889 Other specified postprocedural states: Secondary | ICD-10-CM | POA: Diagnosis not present

## 2022-03-18 ENCOUNTER — Encounter: Payer: Self-pay | Admitting: General Practice

## 2022-04-15 ENCOUNTER — Encounter: Payer: Self-pay | Admitting: Family Medicine

## 2022-04-15 ENCOUNTER — Ambulatory Visit (INDEPENDENT_AMBULATORY_CARE_PROVIDER_SITE_OTHER): Payer: Medicare Other | Admitting: Family Medicine

## 2022-04-15 VITALS — BP 123/67 | HR 66 | Ht 68.0 in | Wt 236.0 lb

## 2022-04-15 DIAGNOSIS — L989 Disorder of the skin and subcutaneous tissue, unspecified: Secondary | ICD-10-CM | POA: Diagnosis not present

## 2022-04-15 NOTE — Progress Notes (Signed)
Kevin Li - 66 y.o. male MRN [id number]  Date of birth: November 14, 1955  Subjective Chief Complaint  Patient presents with   Rash    HPI Kevin Li is a 66 year old male here today with concerns for lesion on chest.  Reports he has noticed a reddened skin area on the chest for several months.  This has possibly changed slightly in size.  Denies any symptoms as far as pain or itching.  He has not tried any treatment on this area.  ROS:  A comprehensive ROS was completed and negative except as noted per HPI  Allergies  Allergen Reactions   Statins Other (See Comments)    Myalgias    Codeine Nausea Only    Past Medical History:  Diagnosis Date   High cholesterol    Hypertension     Past Surgical History:  Procedure Laterality Date   HERNIA REPAIR     Age 19   VASECTOMY      Social History   Socioeconomic History   Marital status: Married    Spouse name: Not on file   Number of children: Not on file   Years of education: Not on file   Highest education level: Not on file  Occupational History   Not on file  Tobacco Use   Smoking status: Former    Types: Cigarettes    Quit date: 07/27/1989    Years since quitting: 32.7   Smokeless tobacco: Never  Vaping Use   Vaping Use: Never used  Substance and Sexual Activity   Alcohol use: Not on file   Drug use: Never   Sexual activity: Yes    Partners: Female  Other Topics Concern   Not on file  Social History Narrative   Not on file   Social Determinants of Health   Financial Resource Strain: Not on file  Food Insecurity: Not on file  Transportation Needs: Not on file  Physical Activity: Not on file  Stress: Not on file  Social Connections: Not on file    Family History  Problem Relation Age of Onset   Hypertension Mother    Heart attack Father     Health Maintenance  Topic Date Due   Zoster Vaccines- Shingrix (1 of 2) 04/26/2022 (Originally 08/16/1974)   COLONOSCOPY (Pts 45-76yr Insurance coverage will need to  be confirmed)  10/15/2022 (Originally 08/16/2000)   TETANUS/TDAP  10/15/2022 (Originally 10/19/2020)   INFLUENZA VACCINE  10/25/2022 (Originally 02/24/2022)   Pneumonia Vaccine 66 Years old  Completed   Hepatitis C Screening  Completed   HPV VACCINES  Aged Out   COVID-19 Vaccine  Discontinued     ----------------------------------------------------------------------------------------------------------------------------------------------------------------------------------------------------------------- Physical Exam BP 123/67 (BP Location: Right Arm, Patient Position: Sitting, Cuff Size: Large)   Pulse 66   Ht '5\' 8"'$  (1.727 m)   Wt 236 lb (107 kg)   SpO2 96%   BMI 35.88 kg/m   Physical Exam Constitutional:      Appearance: Normal appearance.  Eyes:     General: No scleral icterus. Skin:    Comments: Erythematous, slightly raised papule on the left upper chest wall.  Rolled edges with irregular borders.  Neurological:     Mental Status: He is alert.  Psychiatric:        Mood and Affect: Mood normal.        Behavior: Behavior normal.     ------------------------------------------------------------------------------------------------------------------------------------------------------------------------------------------------------------------- Assessment and Plan  Skin lesion of chest wall Areas suspicious for basal cell carcinoma.  Referral placed to dermatology.  No orders of the defined types were placed in this encounter.   No follow-ups on file.    This visit occurred during the SARS-CoV-2 public health emergency.  Safety protocols were in place, including screening questions prior to the visit, additional usage of staff PPE, and extensive cleaning of exam room while observing appropriate contact time as indicated for disinfecting solutions.

## 2022-04-15 NOTE — Assessment & Plan Note (Signed)
Areas suspicious for basal cell carcinoma.  Referral placed to dermatology.

## 2022-04-17 ENCOUNTER — Other Ambulatory Visit: Payer: Self-pay | Admitting: Family Medicine

## 2022-04-21 DIAGNOSIS — D225 Melanocytic nevi of trunk: Secondary | ICD-10-CM | POA: Diagnosis not present

## 2022-04-21 DIAGNOSIS — L821 Other seborrheic keratosis: Secondary | ICD-10-CM | POA: Diagnosis not present

## 2022-04-21 DIAGNOSIS — L720 Epidermal cyst: Secondary | ICD-10-CM | POA: Diagnosis not present

## 2022-04-21 DIAGNOSIS — L570 Actinic keratosis: Secondary | ICD-10-CM | POA: Diagnosis not present

## 2022-04-21 DIAGNOSIS — D2239 Melanocytic nevi of other parts of face: Secondary | ICD-10-CM | POA: Diagnosis not present

## 2022-05-04 DIAGNOSIS — N189 Chronic kidney disease, unspecified: Secondary | ICD-10-CM | POA: Diagnosis not present

## 2022-05-04 DIAGNOSIS — D649 Anemia, unspecified: Secondary | ICD-10-CM | POA: Diagnosis not present

## 2022-07-14 ENCOUNTER — Ambulatory Visit (INDEPENDENT_AMBULATORY_CARE_PROVIDER_SITE_OTHER): Payer: Medicare Other | Admitting: Family Medicine

## 2022-07-14 DIAGNOSIS — Z0000 Encounter for general adult medical examination without abnormal findings: Secondary | ICD-10-CM | POA: Diagnosis not present

## 2022-07-14 NOTE — Progress Notes (Signed)
MEDICARE ANNUAL WELLNESS VISIT  07/14/2022  Telephone Visit Disclaimer This Medicare AWV was conducted by telephone due to national recommendations for restrictions regarding the COVID-19 Pandemic (e.g. social distancing).  I verified, using two identifiers, that I am speaking with Kevin Li or their authorized healthcare agent. I discussed the limitations, risks, security, and privacy concerns of performing an evaluation and management service by telephone and the potential availability of an in-person appointment in the future. The patient expressed understanding and agreed to proceed.  Location of Patient: Home Location of Provider (nurse):  In the office.  Subjective:    Kevin Li is a 66 y.o. male patient of Kevin Nutting, DO who had a Medicare Annual Wellness Visit today via telephone. Kipper is Working part time and lives with their spouse. he has 3 children. he reports that he is socially active and does interact with friends/family regularly. he is minimally physically active and enjoys hiking and sports.  Patient Care Team: Kevin Nutting, DO as PCP - General (Family Medicine)     07/14/2022   10:02 AM  Advanced Directives  Does Patient Have a Medical Advance Directive? Yes  Type of Advance Directive Living will  Does patient want to make changes to medical advance directive? No - Patient declined    Hospital Utilization Over the Past 12 Months: # of hospitalizations or ER visits: 2 # of surgeries: 1  Review of Systems    Patient reports that his overall health is better compared to last year.  History obtained from chart review and the patient  Patient Reported Readings (BP, Pulse, CBG, Weight, etc) none  Pain Assessment Pain : No/denies pain     Current Medications & Allergies (verified) Allergies as of 07/14/2022       Reactions   Statins Other (See Comments)   Myalgias   Codeine Nausea Only        Medication List        Accurate as of  July 14, 2022 10:12 AM. If you have any questions, ask your nurse or doctor.          amLODipine 2.5 MG tablet Commonly known as: NORVASC   aspirin 81 MG chewable tablet Chew by mouth daily.   ezetimibe 10 MG tablet Commonly known as: ZETIA TAKE 1 TABLET BY MOUTH DAILY   labetalol 200 MG tablet Commonly known as: NORMODYNE TAKE 1 TABLET TWICE A DAY   omeprazole 20 MG capsule Commonly known as: PRILOSEC TAKE 1 CAPSULE BY MOUTH TWICE  DAILY BEFORE MEALS        History (reviewed): Past Medical History:  Diagnosis Date   High cholesterol    Hypertension    Past Surgical History:  Procedure Laterality Date   HERNIA REPAIR     Age 66   VASECTOMY     Family History  Problem Relation Age of Onset   Hypertension Mother    Heart attack Father    Social History   Socioeconomic History   Marital status: Married    Spouse name: Fraser Din   Number of children: 3   Years of education: 14   Highest education level: Some college, no degree  Occupational History   Occupation: Retired    Comment: Part-time  Tobacco Use   Smoking status: Former    Types: Cigarettes    Quit date: 07/27/1989    Years since quitting: 32.9   Smokeless tobacco: Never  Vaping Use   Vaping Use: Never used  Substance and Sexual  Activity   Alcohol use: Yes    Comment: occasionally   Drug use: Never   Sexual activity: Yes    Partners: Female  Other Topics Concern   Not on file  Social History Narrative   Lives with spouse. He has three children. He enjoys hiking and sports.   Social Determinants of Health   Financial Resource Strain: Low Risk  (07/14/2022)   Overall Financial Resource Strain (CARDIA)    Difficulty of Paying Living Expenses: Not hard at all  Food Insecurity: No Food Insecurity (07/14/2022)   Hunger Vital Sign    Worried About Running Out of Food in the Last Year: Never true    Ran Out of Food in the Last Year: Never true  Transportation Needs: No Transportation Needs  (07/14/2022)   PRAPARE - Hydrologist (Medical): No    Lack of Transportation (Non-Medical): No  Physical Activity: Sufficiently Active (07/14/2022)   Exercise Vital Sign    Days of Exercise per Week: 6 days    Minutes of Exercise per Session: 30 min  Stress: No Stress Concern Present (07/14/2022)   [place]    Feeling of Stress : Not at all  Social Connections: Socially Isolated (07/14/2022)   Social Connection and Isolation Panel [NHANES]    Frequency of Communication with Friends and Family: Once a week    Frequency of Social Gatherings with Friends and Family: Once a week    Attends Religious Services: Never    Marine scientist or Organizations: No    Attends Archivist Meetings: Never    Marital Status: Married    Activities of Daily Living    07/14/2022   10:02 AM  In your present state of health, do you have any difficulty performing the following activities:  Hearing? 0  Vision? 0  Difficulty concentrating or making decisions? 0  Walking or climbing stairs? 0  Dressing or bathing? 0  Doing errands, shopping? 0  Preparing Food and eating ? N  Using the Toilet? N  In the past six months, have you accidently leaked urine? N  Do you have problems with loss of bowel control? N  Managing your Medications? N  Managing your Finances? N  Housekeeping or managing your Housekeeping? N    Patient Education/ Literacy How often do you need to have someone help you when you read instructions, pamphlets, or other written materials from your doctor or pharmacy?: 1 - Never What is the last grade level you completed in school?: 12th grade and two years of college  Exercise Current Exercise Habits: Home exercise routine, Type of exercise: walking, Time (Minutes): 30, Frequency (Times/Week): 6, Weekly Exercise (Minutes/Week): 180, Intensity: Moderate, Exercise limited by:  None identified  Diet Patient reports consuming 3 meals a day and 1 snack(s) a day Patient reports that his primary diet is: Regular Patient reports that she does have regular access to food.   Depression Screen    07/14/2022   10:03 AM 10/14/2021   10:11 AM 01/09/2021    3:23 PM 05/02/2020    2:51 PM  PHQ 2/9 Scores  PHQ - 2 Score 0 0 0 0  PHQ- 9 Score    0     Fall Risk    07/14/2022   10:03 AM 10/14/2021   10:11 AM 01/09/2021    3:23 PM  [place] in the past year? 0 0 0  Number falls in past yr: 0 0 0  Injury with Fall? 0 0 0  Risk for fall due to : No Fall Risks No Fall Risks No Fall Risks  Follow up Falls evaluation completed Falls evaluation completed Falls evaluation completed     Objective:  Ann Groeneveld seemed alert and oriented and he participated appropriately during our telephone visit.  Blood Pressure Weight BMI  BP Readings from Last 3 Encounters:  04/15/22 123/67  10/23/21 (!) 142/69  10/14/21 136/74   Wt Readings from Last 3 Encounters:  04/15/22 236 lb (107 kg)  10/23/21 242 lb (109.8 kg)  10/14/21 247 lb (112 kg)   BMI Readings from Last 1 Encounters:  04/15/22 35.88 kg/m    *Unable to obtain current vital signs, weight, and BMI due to telephone visit type  Hearing/Vision  Jordi did not seem to have difficulty with hearing/understanding during the telephone conversation Reports that he has not had a formal eye exam by an eye care professional within the past year Reports that he has not had a formal hearing evaluation within the past year *Unable to fully assess hearing and vision during telephone visit type  Cognitive Function:    07/14/2022   10:06 AM  6CIT Screen  What Year? 0 points  What month? 0 points  What time? 0 points  Count back from 20 0 points  Months in reverse 0 points  Repeat phrase 0 points  Total Score 0 points   (Normal:0-7, Significant for Dysfunction: >8)  Normal Cognitive Function Screening:  Yes   Immunization & Health Maintenance Record Immunization History  Administered Date(s) Administered   Influenza,inj,quad, With Preservative 04/26/2014, 05/24/2015   PNEUMOCOCCAL CONJUGATE-20 01/09/2021   Tdap 10/20/2010    Health Maintenance  Topic Date Due   DTaP/Tdap/Td (2 - Td or Tdap) 10/19/2020   Zoster Vaccines- Shingrix (1 of 2) 10/13/2022 (Originally 08/16/1974)   INFLUENZA VACCINE  10/25/2022 (Originally 02/24/2022)   Medicare Annual Wellness (AWV)  07/15/2023   COLONOSCOPY (Pts 45-49yr Insurance coverage will need to be confirmed)  01/16/2026   Pneumonia Vaccine 66 Years old  Completed   Hepatitis C Screening  Completed   HPV VACCINES  Aged Out   COVID-19 Vaccine  Discontinued       Assessment  This is a routine wellness examination for JEllard Li  Health Maintenance: Due or Overdue Health Maintenance Due  Topic Date Due   DTaP/Tdap/Td (2 - Td or Tdap) 10/19/2020    JEllard Artisdoes not need a referral for Community Assistance: Care Management:   no Social Work:    no Prescription Assistance:  no Nutrition/Diabetes Education:  no   Plan:  Personalized Goals  Goals Addressed               This Visit's Progress     Patient Stated (pt-stated)        Patient would like to loose 40 lbs.       Personalized Health Maintenance & Screening Recommendations  Influenza vaccine Shingles vaccine TD vaccine  Patient declined the influenza and shingles vaccine.  Lung Cancer Screening Recommended: no (Low Dose CT Chest recommended if Age 66-80years, 30 pack-year currently smoking OR have quit w/in past 15 years) Hepatitis C Screening recommended: no HIV Screening recommended: no  Advanced Directives: Written information was not prepared per patient's request.  Referrals & Orders No orders of the defined types were placed in this encounter.   Follow-up Plan Follow-up with MLuetta Nutting DO as planned  Schedule tetanus vaccine at the  pharmacy. Medicare wellness visit in one year. Patient will access AVS on my chart.   I have personally reviewed and noted the following in the patient's chart:   Medical and social history Use of alcohol, tobacco or illicit drugs  Current medications and supplements Functional ability and status Nutritional status Physical activity Advanced directives List of other physicians Hospitalizations, surgeries, and ER visits in previous 12 months Vitals Screenings to include cognitive, depression, and falls Referrals and appointments  In addition, I have reviewed and discussed with Kevin Li certain preventive protocols, quality metrics, and best practice recommendations. A written personalized care plan for preventive services as well as general preventive health recommendations is available and can be mailed to the patient at his request.      Tinnie Gens, RN BSN  07/14/2022

## 2022-07-14 NOTE — Patient Instructions (Signed)
Kevin Maintenance Summary and Written Plan of Care  Kevin Li ,  Thank you for allowing me to perform your Medicare Annual Wellness Visit and for your ongoing commitment to your health.   Health Maintenance & Immunization History Health Maintenance  Topic Date Due   DTaP/Tdap/Td (2 - Td or Tdap) 10/19/2020   Zoster Vaccines- Shingrix (1 of 2) 10/13/2022 (Originally 08/16/1974)   INFLUENZA VACCINE  10/25/2022 (Originally 02/24/2022)   Medicare Annual Wellness (AWV)  07/15/2023   COLONOSCOPY (Pts 45-76yr Insurance coverage will need to be confirmed)  01/16/2026   Pneumonia Vaccine 66 Years old  Completed   Hepatitis C Screening  Completed   HPV VACCINES  Aged Out   COVID-19 Vaccine  Discontinued   Immunization History  Administered Date(s) Administered   Influenza,inj,quad, With Preservative 04/26/2014, 05/24/2015   PNEUMOCOCCAL CONJUGATE-20 01/09/2021   Tdap 10/20/2010    These are the patient goals that we discussed:  Goals Addressed               This Visit's Progress     Patient Stated (pt-stated)        Patient would like to loose 40 lbs.         This is a list of Health Maintenance Items that are overdue or due now: Health Maintenance Due  Topic Date Due   DTaP/Tdap/Td (2 - Td or Tdap) 10/19/2020     Orders/Referrals Placed Today: No orders of the defined types were placed in this encounter.  (Contact our referral department at [phone] you have not spoken with someone about your referral appointment within the next 5 days)    Follow-up Plan Follow-up with MLuetta Nutting DO as planned Schedule tetanus vaccine at the pharmacy. Medicare wellness visit in one year. Patient will access AVS on my chart.      Health Maintenance, Male Adopting a healthy lifestyle and getting preventive care are important in promoting health and wellness. Ask your health care provider about: The right schedule for you to have  regular tests and exams. Things you can do on your own to prevent diseases and keep yourself healthy. What should I know about diet, weight, and exercise? Eat a healthy diet  Eat a diet that includes plenty of vegetables, fruits, low-fat dairy products, and lean protein. Do not eat a lot of foods that are high in solid fats, added sugars, or sodium. Maintain a healthy weight Body mass index (BMI) is a measurement that can be used to identify possible weight problems. It estimates body fat based on height and weight. Your health care provider can help determine your BMI and help you achieve or maintain a healthy weight. Get regular exercise Get regular exercise. This is one of the most important things you can do for your health. Most adults should: Exercise for at least 150 minutes each week. The exercise should increase your heart rate and make you sweat (moderate-intensity exercise). Do strengthening exercises at least twice a week. This is in addition to the moderate-intensity exercise. Spend less time sitting. Even light physical activity can be beneficial. Watch cholesterol and blood lipids Have your blood tested for lipids and cholesterol at 66years of age, then have this test every 5 years. You may need to have your cholesterol levels checked more often if: Your lipid or cholesterol levels are high. You are older than 66years of age. You are at high risk for heart disease. What should I know about cancer  screening? Many types of cancers can be detected early and may often be prevented. Depending on your health history and family history, you may need to have cancer screening at various ages. This may include screening for: Colorectal cancer. Prostate cancer. Skin cancer. Lung cancer. What should I know about heart disease, diabetes, and high blood pressure? Blood pressure and heart disease High blood pressure causes heart disease and increases the risk of stroke. This is more  likely to develop in people who have high blood pressure readings or are overweight. Talk with your health care provider about your target blood pressure readings. Have your blood pressure checked: Every 3-5 years if you are 39-35 years of age. Every year if you are 22 years old or older. If you are between the ages of 87 and 39 and are a current or former smoker, ask your health care provider if you should have a one-time screening for abdominal aortic aneurysm (AAA). Diabetes Have regular diabetes screenings. This checks your fasting blood sugar level. Have the screening done: Once every three years after age 55 if you are at a normal weight and have a low risk for diabetes. More often and at a younger age if you are overweight or have a high risk for diabetes. What should I know about preventing infection? Hepatitis B If you have a higher risk for hepatitis B, you should be screened for this virus. Talk with your health care provider to find out if you are at risk for hepatitis B infection. Hepatitis C Blood testing is recommended for: Everyone born from [birth year] through 1965. Anyone with known risk factors for hepatitis C. Sexually transmitted infections (STIs) You should be screened each year for STIs, including gonorrhea and chlamydia, if: You are sexually active and are younger than 66 years of age. You are older than 66 years of age and your health care provider tells you that you are at risk for this type of infection. Your sexual activity has changed since you were last screened, and you are at increased risk for chlamydia or gonorrhea. Ask your health care provider if you are at risk. Ask your health care provider about whether you are at high risk for HIV. Your health care provider may recommend a prescription medicine to help prevent HIV infection. If you choose to take medicine to prevent HIV, you should first get tested for HIV. You should then be tested every 3 months for as long as  you are taking the medicine. Follow these instructions at home: Alcohol use Do not drink alcohol if your health care provider tells you not to drink. If you drink alcohol: Limit how much you have to 0-2 drinks a day. Know how much alcohol is in your drink. In the [place] one drink equals one 12 oz bottle of beer (355 mL), one 5 oz glass of wine (148 mL), or one 1 oz glass of hard liquor (44 mL). Lifestyle Do not use any products that contain nicotine or tobacco. These products include cigarettes, chewing tobacco, and vaping devices, such as e-cigarettes. If you need help quitting, ask your health care provider. Do not use street drugs. Do not share needles. Ask your health care provider for help if you need support or information about quitting drugs. General instructions Schedule regular health, dental, and eye exams. Stay current with your vaccines. Tell your health care provider if: You often feel depressed. You have ever been abused or do not feel safe at home. Summary Adopting a  healthy lifestyle and getting preventive care are important in promoting health and wellness. Follow your health care provider's instructions about healthy diet, exercising, and getting tested or screened for diseases. Follow your health care provider's instructions on monitoring your cholesterol and blood pressure. This information is not intended to replace advice given to you by your health care provider. Make sure you discuss any questions you have with your health care provider. Document Revised: 12/02/2020 Document Reviewed: 12/02/2020 Elsevier Patient Education  [place].

## 2022-08-21 ENCOUNTER — Other Ambulatory Visit: Payer: Self-pay | Admitting: Family Medicine

## 2022-11-03 DIAGNOSIS — R300 Dysuria: Secondary | ICD-10-CM | POA: Diagnosis not present

## 2022-11-03 DIAGNOSIS — N183 Chronic kidney disease, stage 3 unspecified: Secondary | ICD-10-CM | POA: Diagnosis not present

## 2022-11-03 DIAGNOSIS — E039 Hypothyroidism, unspecified: Secondary | ICD-10-CM | POA: Diagnosis not present

## 2022-11-09 ENCOUNTER — Encounter: Payer: Self-pay | Admitting: *Deleted

## 2022-12-01 IMAGING — DX DG LUMBAR SPINE COMPLETE 4+V
5 series · 5 of 5 positions shown · non-contrast
Comparison: CT abdomen pelvis 06/14/2020

CLINICAL DATA: Low back pain

EXAM:
LUMBAR SPINE - COMPLETE 4+ VIEW

[l-spine ap]
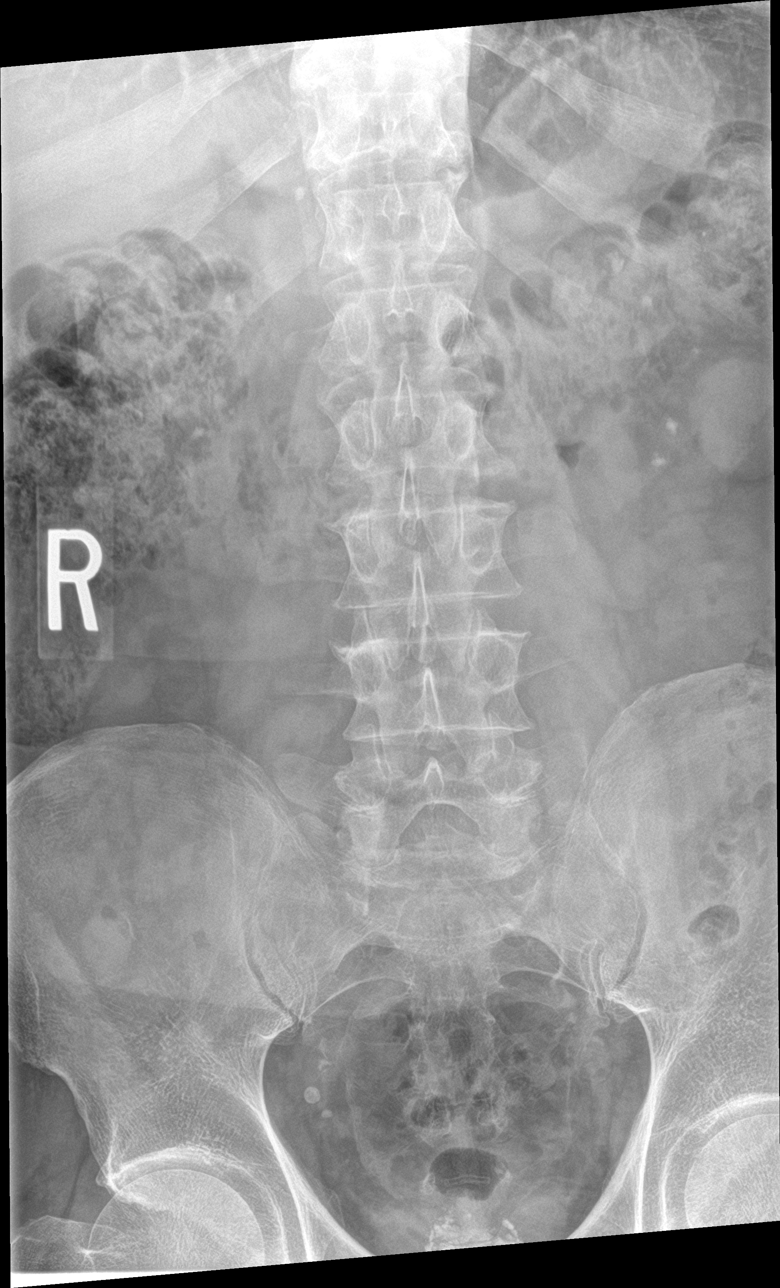

[l-spine obl (1 of 2)]
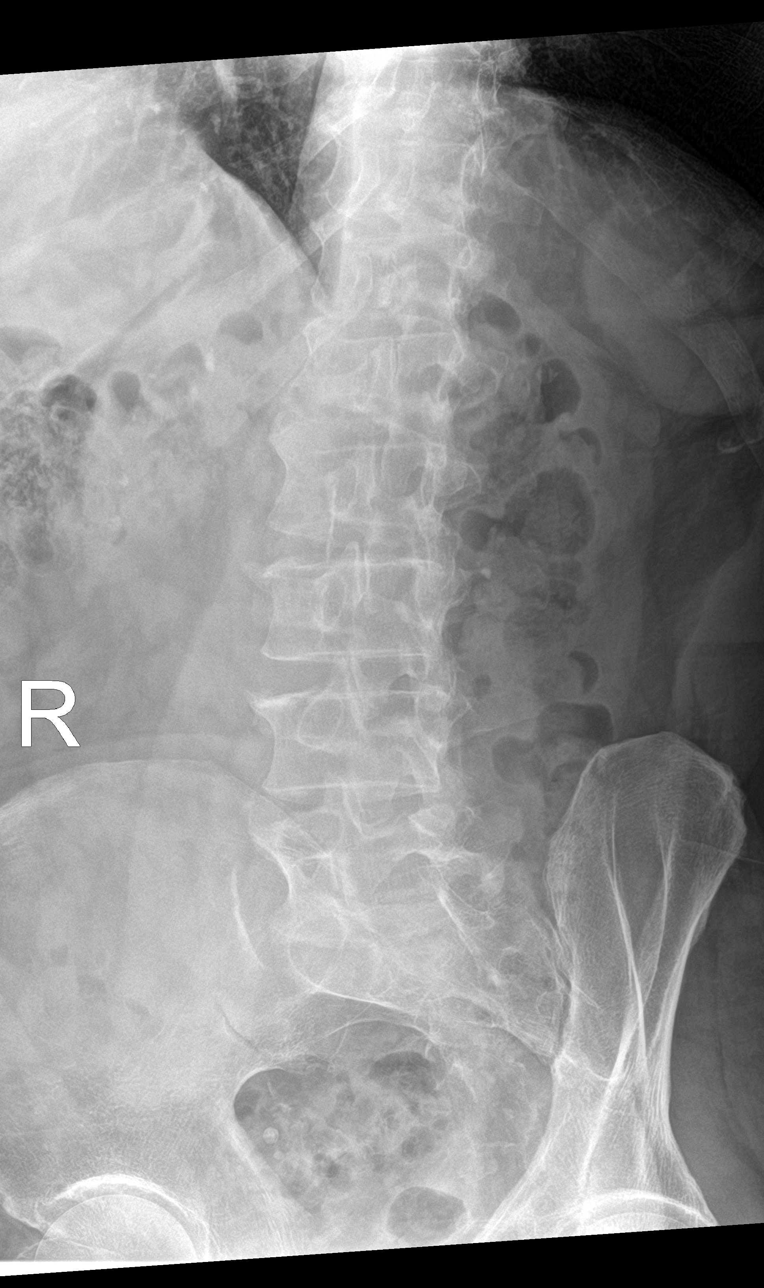

[l-spine obl (2 of 2)]
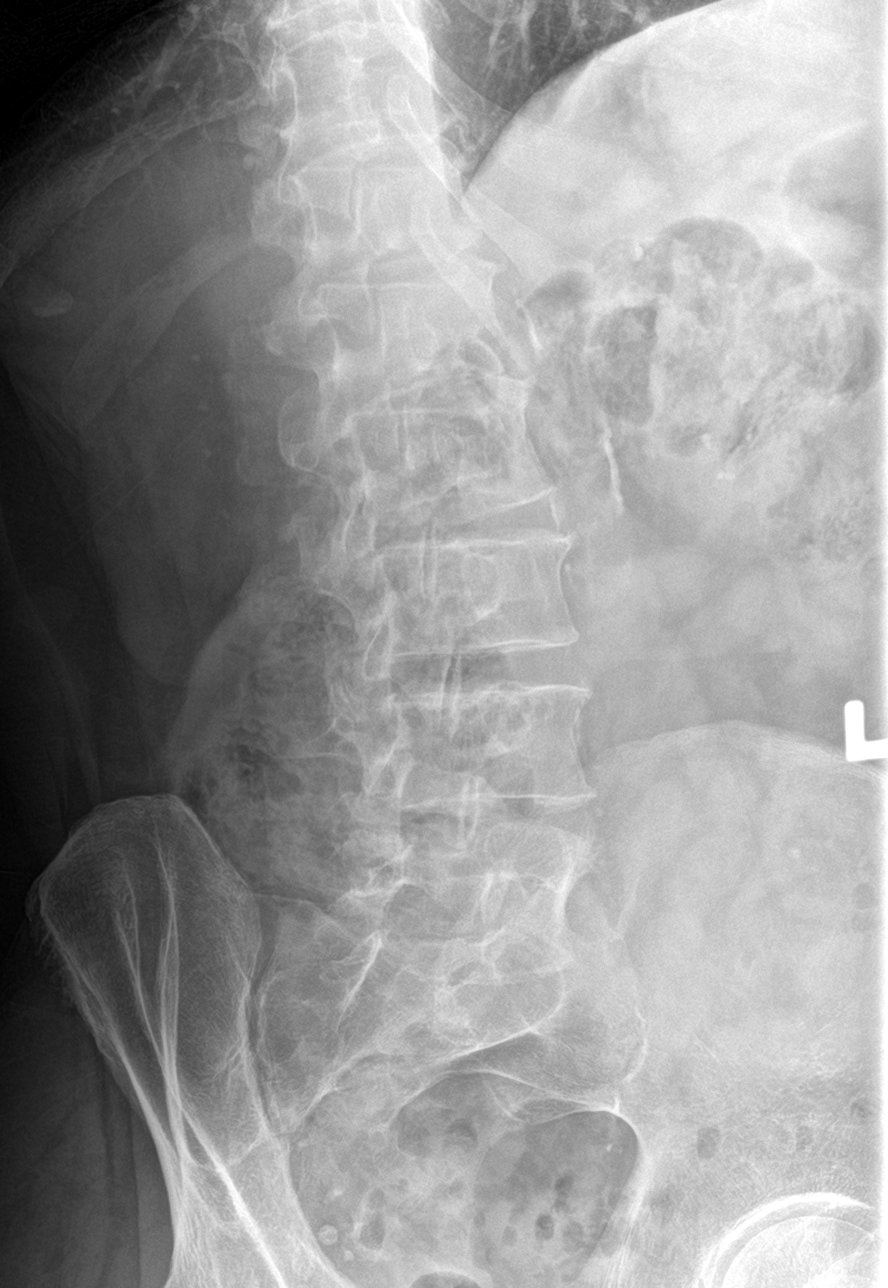

[l-spine lat]
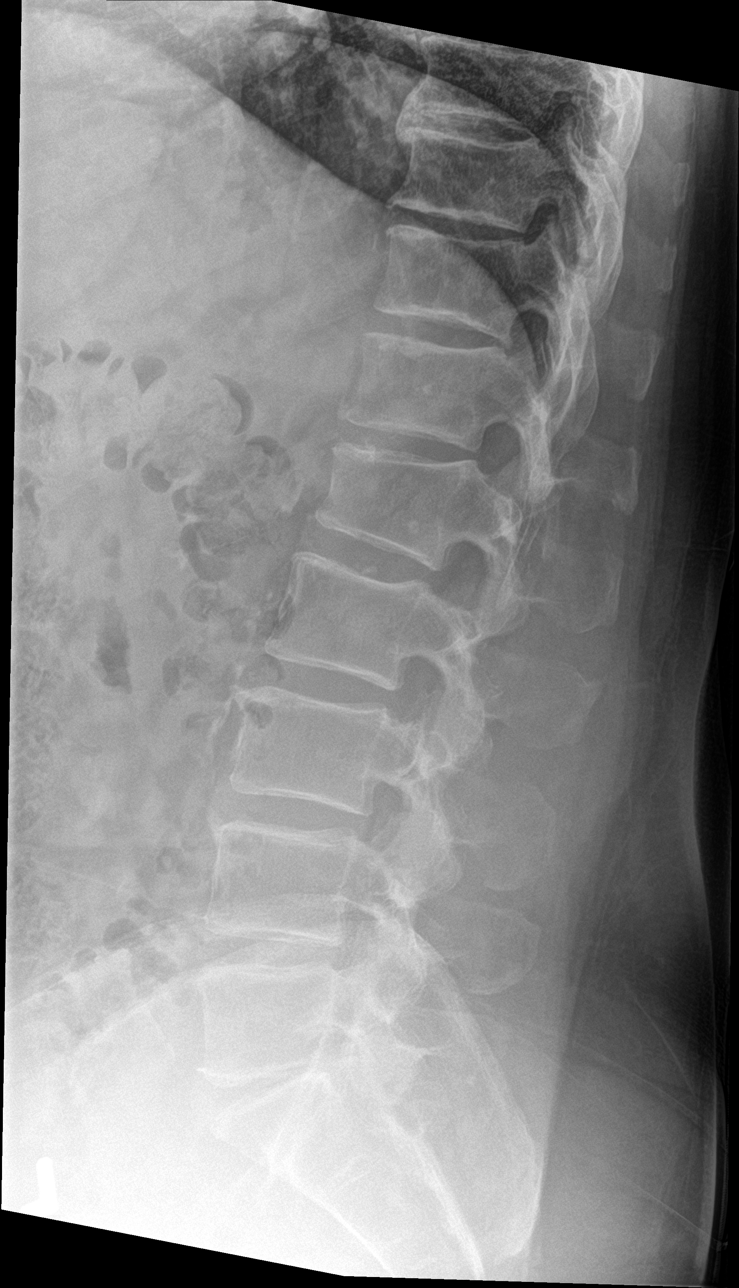

[l-spine spot]
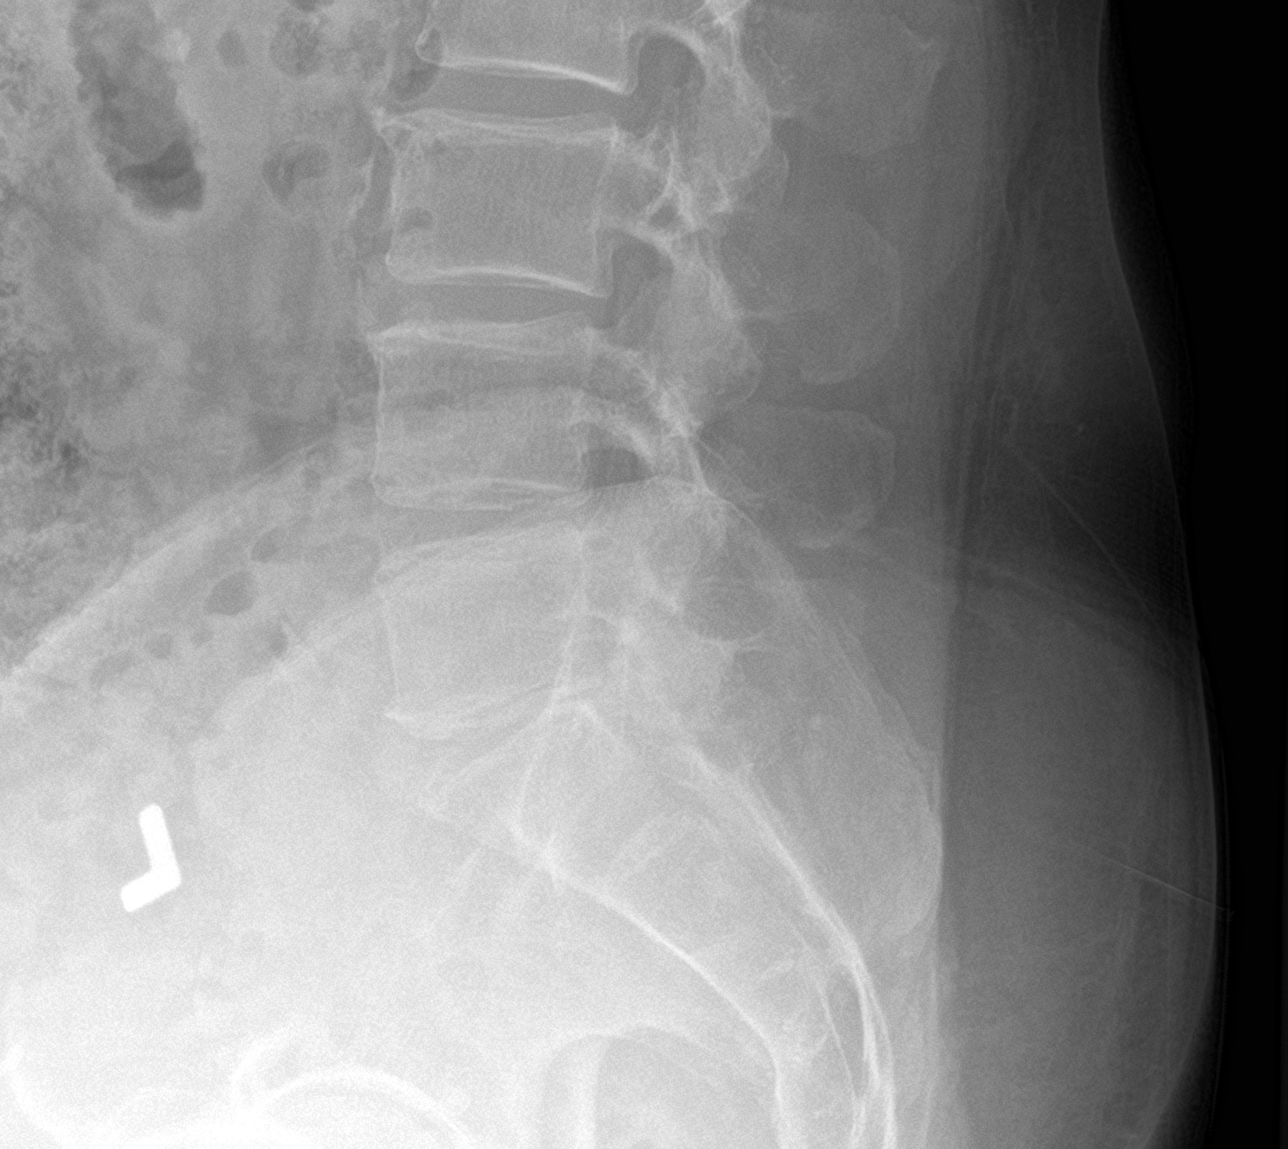

[5 of 5 positions shown; findings below may reference images not displayed]

FINDINGS: Normal lumbar alignment. Mild chronic compression fracture L1
unchanged from the prior CT. No acute fracture or mass. Mild disc
degeneration and disc space narrowing L5-S1. Remaining disc spaces
intact.

Multiple small bilateral renal calculi.
IMPRESSION: Chronic compression fracture L1.  No acute abnormality

Disc degeneration and spurring L5-S1.

## 2022-12-29 ENCOUNTER — Ambulatory Visit (INDEPENDENT_AMBULATORY_CARE_PROVIDER_SITE_OTHER): Payer: Medicare Other | Admitting: Family Medicine

## 2022-12-29 ENCOUNTER — Encounter: Payer: Self-pay | Admitting: Family Medicine

## 2022-12-29 VITALS — BP 145/68 | HR 57 | Ht 68.0 in | Wt 243.0 lb

## 2022-12-29 DIAGNOSIS — E785 Hyperlipidemia, unspecified: Secondary | ICD-10-CM

## 2022-12-29 DIAGNOSIS — M25532 Pain in left wrist: Secondary | ICD-10-CM

## 2022-12-29 DIAGNOSIS — Z0000 Encounter for general adult medical examination without abnormal findings: Secondary | ICD-10-CM

## 2022-12-29 NOTE — Patient Instructions (Signed)
Preventive Care 65 Years and Older, Male Preventive care refers to lifestyle choices and visits with your health care provider that can promote health and wellness. Preventive care visits are also called wellness exams. What can I expect for my preventive care visit? Counseling During your preventive care visit, your health care provider may ask about your: Medical history, including: Past medical problems. Family medical history. History of falls. Current health, including: Emotional well-being. Home life and relationship well-being. Sexual activity. Memory and ability to understand (cognition). Lifestyle, including: Alcohol, nicotine or tobacco, and drug use. Access to firearms. Diet, exercise, and sleep habits. Work and work environment. Sunscreen use. Safety issues such as seatbelt and bike helmet use. Physical exam Your health care provider will check your: Height and weight. These may be used to calculate your BMI (body mass index). BMI is a measurement that tells if you are at a healthy weight. Waist circumference. This measures the distance around your waistline. This measurement also tells if you are at a healthy weight and may help predict your risk of certain diseases, such as type 2 diabetes and high blood pressure. Heart rate and blood pressure. Body temperature. Skin for abnormal spots. What immunizations do I need?  Vaccines are usually given at various ages, according to a schedule. Your health care provider will recommend vaccines for you based on your age, medical history, and lifestyle or other factors, such as travel or where you work. What tests do I need? Screening Your health care provider may recommend screening tests for certain conditions. This may include: Lipid and cholesterol levels. Diabetes screening. This is done by checking your blood sugar (glucose) after you have not eaten for a while (fasting). Hepatitis C test. Hepatitis B test. HIV (human  immunodeficiency virus) test. STI (sexually transmitted infection) testing, if you are at risk. Lung cancer screening. Colorectal cancer screening. Prostate cancer screening. Abdominal aortic aneurysm (AAA) screening. You may need this if you are a current or former smoker. Talk with your health care provider about your test results, treatment options, and if necessary, the need for more tests. Follow these instructions at home: Eating and drinking  Eat a diet that includes fresh fruits and vegetables, whole grains, lean protein, and low-fat dairy products. Limit your intake of foods with high amounts of sugar, saturated fats, and salt. Take vitamin and mineral supplements as recommended by your health care provider. Do not drink alcohol if your health care provider tells you not to drink. If you drink alcohol: Limit how much you have to 0-2 drinks a day. Know how much alcohol is in your drink. In the [place] one drink equals one 12 oz bottle of beer (355 mL), one 5 oz glass of wine (148 mL), or one 1 oz glass of hard liquor (44 mL). Lifestyle Brush your teeth every morning and night with fluoride toothpaste. Floss one time each day. Exercise for at least 30 minutes 5 or more days each week. Do not use any products that contain nicotine or tobacco. These products include cigarettes, chewing tobacco, and vaping devices, such as e-cigarettes. If you need help quitting, ask your health care provider. Do not use drugs. If you are sexually active, practice safe sex. Use a condom or other form of protection to prevent STIs. Take aspirin only as told by your health care provider. Make sure that you understand how much to take and what form to take. Work with your health care provider to find out whether it is safe   and beneficial for you to take aspirin daily. Ask your health care provider if you need to take a cholesterol-lowering medicine (statin). Find healthy ways to manage stress, such  as: Meditation, yoga, or listening to music. Journaling. Talking to a trusted person. Spending time with friends and family. Safety Always wear your seat belt while driving or riding in a vehicle. Do not drive: If you have been drinking alcohol. Do not ride with someone who has been drinking. When you are tired or distracted. While texting. If you have been using any mind-altering substances or drugs. Wear a helmet and other protective equipment during sports activities. If you have firearms in your house, make sure you follow all gun safety procedures. Minimize exposure to UV radiation to reduce your risk of skin cancer. What's next? Visit your health care provider once a year for an annual wellness visit. Ask your health care provider how often you should have your eyes and teeth checked. Stay up to date on all vaccines. This information is not intended to replace advice given to you by your health care provider. Make sure you discuss any questions you have with your health care provider. Document Revised: 01/08/2021 Document Reviewed: 01/08/2021 Elsevier Patient Education  2024 Elsevier Inc.  

## 2022-12-29 NOTE — Assessment & Plan Note (Signed)
Well adult Orders Placed This Encounter  Procedures   Lipid Panel w/reflex Direct LDL  Screenings: per lab orders Immunizations:  Tdap recommended.  Anticipatory guidance/Risk factor reduction:  Recommendations per AVS.

## 2022-12-29 NOTE — Progress Notes (Signed)
Kevin Li - 67 y.o. male MRN [id number]  Date of birth: [birth year]  Subjective Chief Complaint  Patient presents with   Annual Exam    HPI Kevin Li is a 67 y.o. male here today for annual exam.   He is seeing nephrology for CKD and HTN.  Currently on amlodipine and labetalol.  BP is mildly elevated today.   Currently on zetia for cholesterol.  Did not tolerate statins.   He is moderately active.  He feels that diet is OK.  He is having some L wrist pain.  Located on ulnar side of wrist.  Pain with radial deviation.  Has tried a brace without much improvement.   He is a non-smoker.  Occasional EtOH use.   Review of Systems  Constitutional:  Negative for chills, fever, malaise/fatigue and weight loss.  HENT:  Negative for congestion, ear pain and sore throat.   Eyes:  Negative for blurred vision, double vision and pain.  Respiratory:  Negative for cough and shortness of breath.   Cardiovascular:  Negative for chest pain and palpitations.  Gastrointestinal:  Negative for abdominal pain, blood in stool, constipation, heartburn and nausea.  Genitourinary:  Negative for dysuria and urgency.  Musculoskeletal:  Negative for joint pain and myalgias.  Neurological:  Negative for dizziness and headaches.  Endo/Heme/Allergies:  Does not bruise/bleed easily.  Psychiatric/Behavioral:  Negative for depression. The patient is not nervous/anxious and does not have insomnia.      Allergies  Allergen Reactions   Statins Other (See Comments)    Myalgias    Codeine Nausea Only    Past Medical History:  Diagnosis Date   High cholesterol    Hypertension     Past Surgical History:  Procedure Laterality Date   HERNIA REPAIR     Age 67   VASECTOMY      Social History   Socioeconomic History   Marital status: Married    Spouse name: Pat   Number of children: 3   Years of education: 14   Highest education level: Some college, no degree  Occupational History   Occupation:  Retired    Comment: Part-time  Tobacco Use   Smoking status: Former    Types: Cigarettes    Quit date: 07/27/1989    Years since quitting: 33.4   Smokeless tobacco: Never  Vaping Use   Vaping Use: Never used  Substance and Sexual Activity   Alcohol use: Yes    Comment: occasionally   Drug use: Never   Sexual activity: Yes    Partners: Female  Other Topics Concern   Not on file  Social History Narrative   Lives with spouse. He has three children. He enjoys hiking and sports.   Social Determinants of Health   Financial Resource Strain: Low Risk  (07/14/2022)   Overall Financial Resource Strain (CARDIA)    Difficulty of Paying Living Expenses: Not hard at all  Food Insecurity: No Food Insecurity (07/14/2022)   Hunger Vital Sign    Worried About Running Out of Food in the Last Year: Never true    Ran Out of Food in the Last Year: Never true  Transportation Needs: No Transportation Needs (07/14/2022)   PRAPARE - Administrator, Civil Service (Medical): No    Lack of Transportation (Non-Medical): No  Physical Activity: Sufficiently Active (07/14/2022)   Exercise Vital Sign    Days of Exercise per Week: 6 days    Minutes of Exercise per Session: 30 min  Stress: No Stress Concern Present (07/14/2022)   [employer] of Occupational Health - Occupational Stress Questionnaire    Feeling of Stress : Not at all  Social Connections: Socially Isolated (07/14/2022)   Social Connection and Isolation Panel [NHANES]    Frequency of Communication with Friends and Family: Once a week    Frequency of Social Gatherings with Friends and Family: Once a week    Attends Religious Services: Never    Database administrator or Organizations: No    Attends Engineer, structural: Never    Marital Status: Married    Family History  Problem Relation Age of Onset   Hypertension Mother    Heart attack Father     Health Maintenance  Topic Date Due   DTaP/Tdap/Td (2 - Td or  Tdap) 10/19/2020   Zoster Vaccines- Shingrix (1 of 2) 03/31/2023 (Originally 08/16/1974)   INFLUENZA VACCINE  02/25/2023   Medicare Annual Wellness (AWV)  07/15/2023   Colonoscopy  01/16/2026   Pneumonia Vaccine 30+ Years old  Completed   Hepatitis C Screening  Completed   HPV VACCINES  Aged Out   COVID-19 Vaccine  Discontinued     ----------------------------------------------------------------------------------------------------------------------------------------------------------------------------------------------------------------- Physical Exam BP (!) 147/71 (BP Location: Left Arm, Patient Position: Sitting, Cuff Size: Normal)   Pulse (!) 57   Ht 5\' 8"  (1.727 m)   Wt 243 lb (110.2 kg)   SpO2 98%   BMI 36.95 kg/m   Physical Exam Constitutional:      General: He is not in acute distress. HENT:     Head: Normocephalic and atraumatic.     Right Ear: Tympanic membrane and external ear normal.     Left Ear: Tympanic membrane and external ear normal.  Eyes:     General: No scleral icterus. Neck:     Thyroid: No thyromegaly.  Cardiovascular:     Rate and Rhythm: Normal rate and regular rhythm.     Heart sounds: Normal heart sounds.  Pulmonary:     Effort: Pulmonary effort is normal.     Breath sounds: Normal breath sounds.  Abdominal:     General: Bowel sounds are normal. There is no distension.     Palpations: Abdomen is soft.     Tenderness: There is no abdominal tenderness. There is no guarding.  Musculoskeletal:     Cervical back: Normal range of motion.  Lymphadenopathy:     Cervical: No cervical adenopathy.  Skin:    General: Skin is warm and dry.     Findings: No rash.  Neurological:     Mental Status: He is alert and oriented to person, place, and time.     Cranial Nerves: No cranial nerve deficit.     Motor: No abnormal muscle tone.  Psychiatric:        Mood and Affect: Mood normal.        Behavior: Behavior normal.      ------------------------------------------------------------------------------------------------------------------------------------------------------------------------------------------------------------------- Assessment and Plan  Left wrist pain He can try voltaren gel to L wrist and continue brace.  He will let me know if not improving.   Well adult exam Well adult Orders Placed This Encounter  Procedures   Lipid Panel w/reflex Direct LDL  Screenings: per lab orders Immunizations:  Tdap recommended.  Anticipatory guidance/Risk factor reduction:  Recommendations per AVS.    No orders of the defined types were placed in this encounter.   No follow-ups on file.    This visit occurred during the SARS-CoV-2 public health emergency.  Safety protocols were in place, including screening questions prior to the visit, additional usage of staff PPE, and extensive cleaning of exam room while observing appropriate contact time as indicated for disinfecting solutions.

## 2022-12-29 NOTE — Assessment & Plan Note (Signed)
He can try voltaren gel to L wrist and continue brace.  He will let me know if not improving.

## 2022-12-30 LAB — LIPID PANEL W/REFLEX DIRECT LDL
Cholesterol: 219 mg/dL — ABNORMAL HIGH (ref <200)
HDL: 53 mg/dL (ref >=40)
LDL Cholesterol (Calc): 140 mg/dL (calc) — ABNORMAL HIGH
Non-HDL Cholesterol (Calc): 166 mg/dL (calc) — ABNORMAL HIGH (ref <130)
Total CHOL/HDL Ratio: 4.1 (calc) (ref <5.0)
Triglycerides: 134 mg/dL (ref <150)

## 2023-03-31 ENCOUNTER — Other Ambulatory Visit: Payer: Self-pay | Admitting: Family Medicine

## 2023-04-27 DIAGNOSIS — L570 Actinic keratosis: Secondary | ICD-10-CM | POA: Diagnosis not present

## 2023-04-27 DIAGNOSIS — L821 Other seborrheic keratosis: Secondary | ICD-10-CM | POA: Diagnosis not present

## 2023-04-27 DIAGNOSIS — L814 Other melanin hyperpigmentation: Secondary | ICD-10-CM | POA: Diagnosis not present

## 2023-04-27 DIAGNOSIS — D225 Melanocytic nevi of trunk: Secondary | ICD-10-CM | POA: Diagnosis not present

## 2023-04-27 DIAGNOSIS — D2362 Other benign neoplasm of skin of left upper limb, including shoulder: Secondary | ICD-10-CM | POA: Diagnosis not present

## 2023-05-03 ENCOUNTER — Other Ambulatory Visit: Payer: Self-pay | Admitting: Family Medicine

## 2023-05-11 DIAGNOSIS — D6489 Other specified anemias: Secondary | ICD-10-CM | POA: Diagnosis not present

## 2023-05-11 DIAGNOSIS — N183 Chronic kidney disease, stage 3 unspecified: Secondary | ICD-10-CM | POA: Diagnosis not present

## 2023-07-26 ENCOUNTER — Encounter: Payer: Self-pay | Admitting: Family Medicine

## 2023-08-03 ENCOUNTER — Ambulatory Visit (INDEPENDENT_AMBULATORY_CARE_PROVIDER_SITE_OTHER): Payer: Medicare Other | Admitting: Family Medicine

## 2023-08-03 ENCOUNTER — Encounter: Payer: Self-pay | Admitting: Family Medicine

## 2023-08-03 VITALS — BP 140/70 | HR 65 | Ht 68.0 in | Wt 246.0 lb

## 2023-08-03 DIAGNOSIS — E785 Hyperlipidemia, unspecified: Secondary | ICD-10-CM

## 2023-08-03 DIAGNOSIS — I10 Essential (primary) hypertension: Secondary | ICD-10-CM

## 2023-08-03 DIAGNOSIS — Z0000 Encounter for general adult medical examination without abnormal findings: Secondary | ICD-10-CM | POA: Diagnosis not present

## 2023-08-03 DIAGNOSIS — Z125 Encounter for screening for malignant neoplasm of prostate: Secondary | ICD-10-CM

## 2023-08-03 DIAGNOSIS — Z23 Encounter for immunization: Secondary | ICD-10-CM | POA: Diagnosis not present

## 2023-08-03 MED ORDER — AMLODIPINE BESYLATE 5 MG PO TABS: 5.0000 mg | ORAL_TABLET | Freq: Two times a day (BID) | ORAL | 1 refills | Status: DC

## 2023-08-03 NOTE — Progress Notes (Signed)
 Kevin Li - 68 y.o. male MRN [id number]  Date of birth: 1955-10-21  Subjective Chief Complaint  Patient presents with   Annual Exam    HPI Kevin Li is a 68 y.o. male here today for annual exam.   Reports that he is feeling pretty well.  Seeing nephrology for CKD and HTN.  Currently on labetalol  and amlodipine .  BP remains elevated.    Activity level is moderate.  He feels that diet is OK   He is a non-smoker.  Occasional EtOH use.   Review of Systems  Constitutional:  Negative for chills, fever, malaise/fatigue and weight loss.  HENT:  Negative for congestion, ear pain and sore throat.   Eyes:  Negative for blurred vision, double vision and pain.  Respiratory:  Negative for cough and shortness of breath.   Cardiovascular:  Negative for chest pain and palpitations.  Gastrointestinal:  Negative for abdominal pain, blood in stool, constipation, heartburn and nausea.  Genitourinary:  Negative for dysuria and urgency.  Musculoskeletal:  Negative for joint pain and myalgias.  Neurological:  Negative for dizziness and headaches.  Endo/Heme/Allergies:  Does not bruise/bleed easily.  Psychiatric/Behavioral:  Negative for depression. The patient is not nervous/anxious and does not have insomnia.     Allergies  Allergen Reactions   Statins Other (See Comments)    Myalgias    Codeine Nausea Only    Past Medical History:  Diagnosis Date   High cholesterol    Hypertension     Past Surgical History:  Procedure Laterality Date   HERNIA REPAIR     Age 72   VASECTOMY      Social History   Socioeconomic History   Marital status: Married    Spouse name: Garment/textile Technologist   Number of children: 3   Years of education: 14   Highest education level: Associate degree: occupational, scientist, product/process development, or vocational program  Occupational History   Occupation: Retired    Comment: Part-time  Tobacco Use   Smoking status: Former    Current packs/day: 0.00    Types: Cigarettes    Quit date:  07/27/1989    Years since quitting: 34.0   Smokeless tobacco: Never  Vaping Use   Vaping status: Never Used  Substance and Sexual Activity   Alcohol use: Yes    Comment: occasionally   Drug use: Never   Sexual activity: Yes    Partners: Female  Other Topics Concern   Not on file  Social History Narrative   Lives with spouse. He has three children. He enjoys hiking and sports.   Social Drivers of Corporate Investment Banker Strain: Low Risk  (07/28/2023)   Overall Financial Resource Strain (CARDIA)    Difficulty of Paying Living Expenses: Not hard at all  Food Insecurity: No Food Insecurity (07/28/2023)   Hunger Vital Sign    Worried About Running Out of Food in the Last Year: Never true    Ran Out of Food in the Last Year: Never true  Transportation Needs: No Transportation Needs (07/28/2023)   PRAPARE - Administrator, Civil Service (Medical): No    Lack of Transportation (Non-Medical): No  Physical Activity: Sufficiently Active (07/28/2023)   Exercise Vital Sign    Days of Exercise per Week: 3 days    Minutes of Exercise per Session: 150+ min  Stress: No Stress Concern Present (07/28/2023)   [employer] of Occupational Health - Occupational Stress Questionnaire    Feeling of Stress : Not at all  Social Connections: Socially Isolated (07/28/2023)   Social Connection and Isolation Panel [NHANES]    Frequency of Communication with Friends and Family: Once a week    Frequency of Social Gatherings with Friends and Family: Once a week    Attends Religious Services: Never    Database Administrator or Organizations: No    Attends Engineer, Structural: Not on file    Marital Status: Married    Family History  Problem Relation Age of Onset   Hypertension Mother    Heart attack Father     Health Maintenance  Topic Date Due   Zoster Vaccines- Shingrix (1 of 2) Never done   [employer] Wellness (AWV)  07/15/2023   INFLUENZA VACCINE  10/25/2023 (Originally  02/25/2023)   Colonoscopy  01/16/2026   DTaP/Tdap/Td (3 - Td or Tdap) 08/02/2033   Pneumonia Vaccine 6+ Years old  Completed   Hepatitis C Screening  Completed   HPV VACCINES  Aged Out   COVID-19 Vaccine  Discontinued     ----------------------------------------------------------------------------------------------------------------------------------------------------------------------------------------------------------------- Physical Exam BP (!) 154/76 (BP Location: Left Arm, Patient Position: Sitting, Cuff Size: Large)   Pulse 65   Ht 5' 8 (1.727 m)   Wt 246 lb (111.6 kg)   SpO2 95%   BMI 37.40 kg/m   Physical Exam Constitutional:      General: He is not in acute distress. HENT:     Head: Normocephalic and atraumatic.     Right Ear: Tympanic membrane and external ear normal.     Left Ear: Tympanic membrane and external ear normal.  Eyes:     General: No scleral icterus. Neck:     Thyroid : No thyromegaly.  Cardiovascular:     Rate and Rhythm: Normal rate and regular rhythm.     Heart sounds: Normal heart sounds.  Pulmonary:     Effort: Pulmonary effort is normal.     Breath sounds: Normal breath sounds.  Abdominal:     General: Bowel sounds are normal. There is no distension.     Palpations: Abdomen is soft.     Tenderness: There is no abdominal tenderness. There is no guarding.  Musculoskeletal:     Cervical back: Normal range of motion.  Lymphadenopathy:     Cervical: No cervical adenopathy.  Skin:    General: Skin is warm and dry.     Findings: No rash.  Neurological:     Mental Status: He is alert and oriented to person, place, and time.     Cranial Nerves: No cranial nerve deficit.     Motor: No abnormal muscle tone.  Psychiatric:        Mood and Affect: Mood normal.        Behavior: Behavior normal.      ------------------------------------------------------------------------------------------------------------------------------------------------------------------------------------------------------------------- Assessment and Plan  Well adult exam Well adult  Orders Placed This Encounter  Procedures   Tdap vaccine greater than or equal to 7yo IM   CMP14+EGFR   CBC with Differential/Platelet   Lipid Panel With LDL/HDL Ratio   PSA  Screenings: per lab orders Immunizations: Declines shingrix Anticipatory guidance/Risk factor reduction:  Recommendations per AVS.    Meds ordered this encounter  Medications   amLODipine  (NORVASC ) 5 MG tablet    Sig: Take 1 tablet (5 mg total) by mouth in the morning and at bedtime.    Dispense:  180 tablet    Refill:  1    No follow-ups on file.    This visit occurred during the  SARS-CoV-2 public health emergency.  Safety protocols were in place, including screening questions prior to the visit, additional usage of staff PPE, and extensive cleaning of exam room while observing appropriate contact time as indicated for disinfecting solutions.

## 2023-08-03 NOTE — Patient Instructions (Signed)
 Increase amlodipine  to 5mg  twice per day.  Return in 3 weeks for nurse visit to check BP.    Preventive Care 49 Years and Older, Male Preventive care refers to lifestyle choices and visits with your health care provider that can promote health and wellness. Preventive care visits are also called wellness exams. What can I expect for my preventive care visit? Counseling During your preventive care visit, your health care provider may ask about your: Medical history, including: Past medical problems. Family medical history. History of falls. Current health, including: Emotional well-being. Home life and relationship well-being. Sexual activity. Memory and ability to understand (cognition). Lifestyle, including: Alcohol, nicotine or tobacco, and drug use. Access to firearms. Diet, exercise, and sleep habits. Work and work astronomer. Sunscreen use. Safety issues such as seatbelt and bike helmet use. Physical exam Your health care provider will check your: Height and weight. These may be used to calculate your BMI (body mass index). BMI is a measurement that tells if you are at a healthy weight. Waist circumference. This measures the distance around your waistline. This measurement also tells if you are at a healthy weight and may help predict your risk of certain diseases, such as type 2 diabetes and high blood pressure. Heart rate and blood pressure. Body temperature. Skin for abnormal spots. What immunizations do I need?  Vaccines are usually given at various ages, according to a schedule. Your health care provider will recommend vaccines for you based on your age, medical history, and lifestyle or other factors, such as travel or where you work. What tests do I need? Screening Your health care provider may recommend screening tests for certain conditions. This may include: Lipid and cholesterol levels. Diabetes screening. This is done by checking your blood sugar (glucose)  after you have not eaten for a while (fasting). Hepatitis C test. Hepatitis B test. HIV (human immunodeficiency virus) test. STI (sexually transmitted infection) testing, if you are at risk. Lung cancer screening. Colorectal cancer screening. Prostate cancer screening. Abdominal aortic aneurysm (AAA) screening. You may need this if you are a current or former smoker. Talk with your health care provider about your test results, treatment options, and if necessary, the need for more tests. Follow these instructions at home: Eating and drinking  Eat a diet that includes fresh fruits and vegetables, whole grains, lean protein, and low-fat dairy products. Limit your intake of foods with high amounts of sugar, saturated fats, and salt. Take vitamin and mineral supplements as recommended by your health care provider. Do not drink alcohol if your health care provider tells you not to drink. If you drink alcohol: Limit how much you have to 0-2 drinks a day. Know how much alcohol is in your drink. In the [place] one drink equals one 12 oz bottle of beer (355 mL), one 5 oz glass of wine (148 mL), or one 1 oz glass of hard liquor (44 mL). Lifestyle Brush your teeth every morning and night with fluoride toothpaste. Floss one time each day. Exercise for at least 30 minutes 5 or more days each week. Do not use any products that contain nicotine or tobacco. These products include cigarettes, chewing tobacco, and vaping devices, such as e-cigarettes. If you need help quitting, ask your health care provider. Do not use drugs. If you are sexually active, practice safe sex. Use a condom or other form of protection to prevent STIs. Take aspirin  only as told by your health care provider. Make sure that you understand how  much to take and what form to take. Work with your health care provider to find out whether it is safe and beneficial for you to take aspirin  daily. Ask your health care provider if you need to  take a cholesterol-lowering medicine (statin). Find healthy ways to manage stress, such as: Meditation, yoga, or listening to music. Journaling. Talking to a trusted person. Spending time with friends and family. Safety Always wear your seat belt while driving or riding in a vehicle. Do not drive: If you have been drinking alcohol. Do not ride with someone who has been drinking. When you are tired or distracted. While texting. If you have been using any mind-altering substances or drugs. Wear a helmet and other protective equipment during sports activities. If you have firearms in your house, make sure you follow all gun safety procedures. Minimize exposure to UV radiation to reduce your risk of skin cancer. What's next? Visit your health care provider once a year for an annual wellness visit. Ask your health care provider how often you should have your eyes and teeth checked. Stay up to date on all vaccines. This information is not intended to replace advice given to you by your health care provider. Make sure you discuss any questions you have with your health care provider. Document Revised: 01/08/2021 Document Reviewed: 01/08/2021 Elsevier Patient Education  2024 [employer].

## 2023-08-03 NOTE — Assessment & Plan Note (Signed)
 Well adult  Orders Placed This Encounter  Procedures   Tdap vaccine greater than or equal to 68yo IM   CMP14+EGFR   CBC with Differential/Platelet   Lipid Panel With LDL/HDL Ratio   PSA  Screenings: per lab orders Immunizations: Declines shingrix Anticipatory guidance/Risk factor reduction:  Recommendations per AVS.

## 2023-08-04 LAB — CBC WITH DIFFERENTIAL/PLATELET
Basophils Absolute: 0 10*3/uL (ref 0.0–0.2)
Basos: 1 %
EOS (ABSOLUTE): 0.4 10*3/uL (ref 0.0–0.4)
Eos: 6 %
Hematocrit: 49.9 % (ref 37.5–51.0)
Hemoglobin: 17.2 g/dL (ref 13.0–17.7)
Immature Grans (Abs): 0 10*3/uL (ref 0.0–0.1)
Immature Granulocytes: 0 %
Lymphocytes Absolute: 1.4 10*3/uL (ref 0.7–3.1)
Lymphs: 19 %
MCH: 30.8 pg (ref 26.6–33.0)
MCHC: 34.5 g/dL (ref 31.5–35.7)
MCV: 89 fL (ref 79–97)
Monocytes Absolute: 1 10*3/uL — ABNORMAL HIGH (ref 0.1–0.9)
Monocytes: 14 %
Neutrophils Absolute: 4.2 10*3/uL (ref 1.4–7.0)
Neutrophils: 60 %
Platelets: 297 10*3/uL (ref 150–450)
RBC: 5.59 x10E6/uL (ref 4.14–5.80)
RDW: 11.9 % (ref 11.6–15.4)
WBC: 7 10*3/uL (ref 3.4–10.8)

## 2023-08-04 LAB — CMP14+EGFR
ALT: 24 [IU]/L (ref 0–44)
AST: 20 [IU]/L (ref 0–40)
Albumin: 4.4 g/dL (ref 3.9–4.9)
Alkaline Phosphatase: 105 [IU]/L (ref 44–121)
BUN/Creatinine Ratio: 10 (ref 10–24)
BUN: 19 mg/dL (ref 8–27)
Bilirubin Total: 1 mg/dL (ref 0.0–1.2)
CO2: 24 mmol/L (ref 20–29)
Calcium: 9.6 mg/dL (ref 8.6–10.2)
Chloride: 103 mmol/L (ref 96–106)
Creatinine, Ser: 1.93 mg/dL — ABNORMAL HIGH (ref 0.76–1.27)
Globulin, Total: 2.3 g/dL (ref 1.5–4.5)
Glucose: 94 mg/dL (ref 70–99)
Potassium: 4.9 mmol/L (ref 3.5–5.2)
Sodium: 140 mmol/L (ref 134–144)
Total Protein: 6.7 g/dL (ref 6.0–8.5)
eGFR: 37 mL/min/{1.73_m2} — ABNORMAL LOW (ref >59)

## 2023-08-04 LAB — LIPID PANEL WITH LDL/HDL RATIO
Cholesterol, Total: 235 mg/dL — ABNORMAL HIGH (ref 100–199)
HDL: 56 mg/dL (ref >39)
LDL Chol Calc (NIH): 157 mg/dL — ABNORMAL HIGH (ref 0–99)
LDL/HDL Ratio: 2.8 {ratio} (ref 0.0–3.6)
Triglycerides: 122 mg/dL (ref 0–149)
VLDL Cholesterol Cal: 22 mg/dL (ref 5–40)

## 2023-08-04 LAB — PSA: Prostate Specific Ag, Serum: 2.7 ng/mL (ref 0.0–4.0)

## 2023-08-13 ENCOUNTER — Encounter: Payer: Self-pay | Admitting: Family Medicine

## 2023-08-24 ENCOUNTER — Ambulatory Visit (INDEPENDENT_AMBULATORY_CARE_PROVIDER_SITE_OTHER): Payer: Medicare Other

## 2023-08-24 VITALS — BP 129/59 | HR 57 | Ht 68.0 in

## 2023-08-24 DIAGNOSIS — I10 Essential (primary) hypertension: Secondary | ICD-10-CM

## 2023-08-24 NOTE — Patient Instructions (Signed)
Schedule 6 month follow up with Dr. Ashley Royalty for HTN follow up.

## 2023-08-24 NOTE — Progress Notes (Signed)
   Established Patient Office Visit  Subjective   Patient ID: Kevin Li, male    DOB: April 09, 1956  Age: 68 y.o. MRN: [id number]  Chief Complaint  Patient presents with   Hypertension    BP check nurse visit.     HPI  Hypertension. BP check nurse visit. Patient denies chest pain, shortness of breath, dizziness, palpitations or medication problem.   ROS    Objective:     BP 131/61 (BP Location: Left Arm, Patient Position: Sitting, Cuff Size: Large)   Pulse (!) 57   Ht 5\' 8"  (1.727 m)   SpO2 98%   BMI 37.40 kg/m    Physical Exam   No results found for any visits on 08/24/23.    The 10-year ASCVD risk score (Arnett DK, et al., 2019) is: 19.6%    Assessment & Plan:  BP check nurse visit. Initial reading =131/61.second reading = 129/59. Per Dr. Ashley Royalty continue current medication regimen. Schedule a follow up visit with Dr. Ashley Royalty in 6 months.  Problem List Items Addressed This Visit   None   No follow-ups on file.    Elizabeth Palau, LPN

## 2023-09-15 DIAGNOSIS — N183 Chronic kidney disease, stage 3 unspecified: Secondary | ICD-10-CM | POA: Diagnosis not present

## 2023-09-15 DIAGNOSIS — E7800 Pure hypercholesterolemia, unspecified: Secondary | ICD-10-CM | POA: Diagnosis not present

## 2023-10-07 ENCOUNTER — Other Ambulatory Visit: Payer: Self-pay | Admitting: Family Medicine

## 2023-12-14 ENCOUNTER — Other Ambulatory Visit: Payer: Self-pay | Admitting: Family Medicine

## 2023-12-15 ENCOUNTER — Other Ambulatory Visit: Payer: Self-pay | Admitting: Family Medicine

## 2024-01-04 ENCOUNTER — Encounter: Payer: Medicare Other | Admitting: Family Medicine

## 2024-01-13 ENCOUNTER — Other Ambulatory Visit: Payer: Self-pay | Admitting: Family Medicine

## 2024-01-19 DIAGNOSIS — D631 Anemia in chronic kidney disease: Secondary | ICD-10-CM | POA: Diagnosis not present

## 2024-01-19 DIAGNOSIS — N183 Chronic kidney disease, stage 3 unspecified: Secondary | ICD-10-CM | POA: Diagnosis not present

## 2024-02-22 ENCOUNTER — Ambulatory Visit (INDEPENDENT_AMBULATORY_CARE_PROVIDER_SITE_OTHER): Payer: Medicare Other | Admitting: Family Medicine

## 2024-02-22 ENCOUNTER — Encounter: Payer: Self-pay | Admitting: Family Medicine

## 2024-02-22 VITALS — BP 148/72 | HR 54 | Ht 68.0 in | Wt 230.0 lb

## 2024-02-22 DIAGNOSIS — E785 Hyperlipidemia, unspecified: Secondary | ICD-10-CM

## 2024-02-22 DIAGNOSIS — I10 Essential (primary) hypertension: Secondary | ICD-10-CM | POA: Diagnosis not present

## 2024-02-22 MED ORDER — AMLODIPINE BESYLATE 10 MG PO TABS: 10.0000 mg | ORAL_TABLET | Freq: Every day | ORAL | 1 refills | Status: DC

## 2024-02-22 NOTE — Progress Notes (Deleted)
Subjective:    Patient here for follow-up of elevated blood pressure.  He {is/is not:9024}{} exercising and {is/is not:9024}{} adherent to a low-salt diet.  Blood pressure {is/is not:9024}{} well controlled at home. Cardiac symptoms: {symptoms:12860}{}. Patient denies: {symptoms; cardiac:19145}{}. Cardiovascular risk factors: {risk factors:510}{}. Use of agents associated with hypertension: {meds:511}{}. History of target organ damage: {[phone]}{}.  {Common ambulatory SmartLinks:19316}  Review of Systems {ros; complete:30496}{}     Objective:    {exam; complete:17964}{}    Assessment:    Hypertension, {control:10681}{} ***. Evidence of target organ damage: {target [phone]}{}.    Plan:    {[phone]}{}

## 2024-02-22 NOTE — Assessment & Plan Note (Signed)
 He has had statin intolerance.  Doing ok with Zetia .  Will continue.

## 2024-02-22 NOTE — Assessment & Plan Note (Addendum)
 BP continues to remain elevated.  Increased amlodipine  to 10mg  daily.  Return in 2 weeks for nurse visit to recheck BP.

## 2024-02-22 NOTE — Patient Instructions (Signed)
 Increase amlodipine to 10 mg daily

## 2024-02-22 NOTE — Progress Notes (Signed)
 Kevin Li - 68 y.o. male MRN [id number]  Date of birth: Aug 15, 1955  Subjective Chief Complaint  Patient presents with   Hypertension    HPI Kevin Li is a 68 y.o. male here today for follow up visit.  He reports that he is doing pretty well.   He continues on amlodipine , labetalol  and terazosin.  BP has been  fairly well controlled with current medications.  He is seeing nephrology as well for CKD3B.   He denies symptoms related to HTN including chest pain, shortness of breath, palpitations, headache or vision changes.   ROS:  A comprehensive ROS was completed and negative except as noted per HPI  Allergies  Allergen Reactions   Statins Other (See Comments)    Myalgias    Codeine Nausea Only    Past Medical History:  Diagnosis Date   High cholesterol    Hypertension     Past Surgical History:  Procedure Laterality Date   HERNIA REPAIR     Age 54   VASECTOMY      Social History   Socioeconomic History   Marital status: Married    Spouse name: Garment/textile technologist   Number of children: 3   Years of education: 14   Highest education level: Associate degree: occupational, Scientist, product/process development, or vocational program  Occupational History   Occupation: Retired    Comment: Part-time  Tobacco Use   Smoking status: Former    Current packs/day: 0.00    Types: Cigarettes    Quit date: 07/27/1989    Years since quitting: 34.5   Smokeless tobacco: Never  Vaping Use   Vaping status: Never Used  Substance and Sexual Activity   Alcohol use: Yes    Comment: occasionally   Drug use: Never   Sexual activity: Yes    Partners: Female  Other Topics Concern   Not on file  Social History Narrative   Lives with spouse. He has three children. He enjoys hiking and sports.   Social Drivers of Corporate investment banker Strain: Low Risk  (07/28/2023)   Overall Financial Resource Strain (CARDIA)    Difficulty of Paying Living Expenses: Not hard at all  Food Insecurity: No Food Insecurity (07/28/2023)    Hunger Vital Sign    Worried About Running Out of Food in the Last Year: Never true    Ran Out of Food in the Last Year: Never true  Transportation Needs: No Transportation Needs (07/28/2023)   PRAPARE - Administrator, Civil Service (Medical): No    Lack of Transportation (Non-Medical): No  Physical Activity: Sufficiently Active (07/28/2023)   Exercise Vital Sign    Days of Exercise per Week: 3 days    Minutes of Exercise per Session: 150+ min  Stress: No Stress Concern Present (07/28/2023)   [employer] of Occupational Health - Occupational Stress Questionnaire    Feeling of Stress : Not at all  Social Connections: Socially Isolated (07/28/2023)   Social Connection and Isolation Panel    Frequency of Communication with Friends and Family: Once a week    Frequency of Social Gatherings with Friends and Family: Once a week    Attends Religious Services: Never    Database administrator or Organizations: No    Attends Engineer, structural: Not on file    Marital Status: Married    Family History  Problem Relation Age of Onset   Hypertension Mother    Heart attack Father     Health Maintenance  Topic Date Due   Zoster Vaccines- Shingrix (1 of 2) 05/24/2024 (Originally 08/16/1974)   Medicare Annual Wellness (AWV)  02/21/2025 (Originally 07/15/2023)   INFLUENZA VACCINE  02/25/2024   Colonoscopy  01/16/2026   DTaP/Tdap/Td (3 - Td or Tdap) 08/02/2033   Pneumococcal Vaccine: 50+ Years  Completed   Hepatitis C Screening  Completed   Hepatitis B Vaccines  Aged Out   HPV VACCINES  Aged Out   Meningococcal B Vaccine  Aged Out   COVID-19 Vaccine  Discontinued     ----------------------------------------------------------------------------------------------------------------------------------------------------------------------------------------------------------------- Physical Exam BP (!) 148/72   Pulse (!) 54   Ht 5' 8 (1.727 m)   Wt 230 lb (104.3 kg)   SpO2  98%   BMI 34.97 kg/m   Physical Exam Constitutional:      Appearance: Normal appearance.  Cardiovascular:     Rate and Rhythm: Normal rate and regular rhythm.  Neurological:     Mental Status: He is alert.     ------------------------------------------------------------------------------------------------------------------------------------------------------------------------------------------------------------------- Assessment and Plan  HLD (hyperlipidemia) He has had statin intolerance.  Doing ok with Zetia .  Will continue.     Essential hypertension Blood pressure is elevated today. He has had readings at home have been much better controlled with readings of 120-130/75-80.  He will continue to monitor this at home.   Meds ordered this encounter  Medications   amLODipine  (NORVASC ) 10 MG tablet    Sig: Take 1 tablet (10 mg total) by mouth daily.    Dispense:  100 tablet    Refill:  1    Please send a replace/new response with 100-Day Supply if appropriate to maximize member benefit. Requesting 1 year supply.    Return in about 6 months (around 08/24/2024) for Annual Exam.

## 2024-03-07 ENCOUNTER — Ambulatory Visit (INDEPENDENT_AMBULATORY_CARE_PROVIDER_SITE_OTHER)

## 2024-03-07 VITALS — BP 131/58 | HR 52 | Ht 68.0 in

## 2024-03-07 DIAGNOSIS — I10 Essential (primary) hypertension: Secondary | ICD-10-CM

## 2024-03-07 NOTE — Patient Instructions (Signed)
 Per Dr. Alvia continue to take Amlodipine  5mg . Keep next scheduled appointment for 08/29/2024 with himself.

## 2024-03-07 NOTE — Progress Notes (Signed)
   Established Patient Office Visit  Subjective   Patient ID: Kevin Li, male    DOB: [birth year]  Age: 68 y.o. MRN: [id number]  Chief Complaint  Patient presents with   Hypertension    BP check nurse visit    HPI  Hypertension. BP check nurse visit. Patient denies chest pain shortness of breath, dizziness, palpitations, vision changes or problems with medication.  Patient states he has been taking medication incorrectly since last visit.  He states he has been taking Amlodipine as 2.5mg   instead of increasing to 10mg .  He did start back at 5mg  for the past 2 weeks per patient.   ROS    Objective:     BP (!) 131/58   Pulse (!) 52   Ht 5' 8 (1.727 m)   SpO2 98%   BMI 34.97 kg/m    Physical Exam   No results found for any visits on 03/07/24.    The 10-year ASCVD risk score (Arnett DK, et al., 2019) is: 19%    Assessment & Plan:  BP check - initial reading= 142/57 and second reading = 131/58.  Per Dr. Alvia continue with 5mg   and keep upcoming appt scheduled for 08/29/2024. ( Patient instructed to double 2.5mg  tablet or cut 10mg  tablet in half to reach the recommended strength of 5mg -patient voiced understanding )  Problem List Items Addressed This Visit       Cardiovascular and Mediastinum   Essential hypertension - Primary    Return in about 25 weeks (around 08/29/2024).    Kevin SHAUNNA Plenty, LPN

## 2024-05-02 DIAGNOSIS — L814 Other melanin hyperpigmentation: Secondary | ICD-10-CM | POA: Diagnosis not present

## 2024-05-02 DIAGNOSIS — D225 Melanocytic nevi of trunk: Secondary | ICD-10-CM | POA: Diagnosis not present

## 2024-05-02 DIAGNOSIS — L578 Other skin changes due to chronic exposure to nonionizing radiation: Secondary | ICD-10-CM | POA: Diagnosis not present

## 2024-05-02 DIAGNOSIS — L821 Other seborrheic keratosis: Secondary | ICD-10-CM | POA: Diagnosis not present

## 2024-07-27 ENCOUNTER — Other Ambulatory Visit: Payer: Self-pay | Admitting: Family Medicine

## 2024-08-29 ENCOUNTER — Encounter: Payer: Self-pay | Admitting: Family Medicine

## 2024-08-29 ENCOUNTER — Ambulatory Visit (INDEPENDENT_AMBULATORY_CARE_PROVIDER_SITE_OTHER): Admitting: Family Medicine

## 2024-08-29 VITALS — BP 150/60 | HR 59 | Ht 68.0 in | Wt 237.0 lb

## 2024-08-29 DIAGNOSIS — Z125 Encounter for screening for malignant neoplasm of prostate: Secondary | ICD-10-CM

## 2024-08-29 DIAGNOSIS — I10 Essential (primary) hypertension: Secondary | ICD-10-CM | POA: Diagnosis not present

## 2024-08-29 DIAGNOSIS — E785 Hyperlipidemia, unspecified: Secondary | ICD-10-CM | POA: Diagnosis not present

## 2024-08-29 DIAGNOSIS — Z0000 Encounter for general adult medical examination without abnormal findings: Secondary | ICD-10-CM | POA: Diagnosis not present

## 2024-08-29 MED ORDER — AMLODIPINE BESYLATE 10 MG PO TABS: 10.0000 mg | ORAL_TABLET | Freq: Every day | ORAL | 2 refills | Status: AC

## 2024-08-29 NOTE — Patient Instructions (Signed)
 Preventive Care 73 Years and Older, Male Preventive care refers to lifestyle choices and visits with your health care provider that can promote health and wellness. Preventive care visits are also called wellness exams. What can I expect for my preventive care visit? Counseling During your preventive care visit, your health care provider may ask about your: Medical history, including: Past medical problems. Family medical history. History of falls. Current health, including: Emotional well-being. Home life and relationship well-being. Sexual activity. Memory and ability to understand (cognition). Lifestyle, including: Alcohol, nicotine or tobacco, and drug use. Access to firearms. Diet, exercise, and sleep habits. Work and work Astronomer. Sunscreen use. Safety issues such as seatbelt and bike helmet use. Physical exam Your health care provider will check your: Height and weight. These may be used to calculate your BMI (body mass index). BMI is a measurement that tells if you are at a healthy weight. Waist circumference. This measures the distance around your waistline. This measurement also tells if you are at a healthy weight and may help predict your risk of certain diseases, such as type 2 diabetes and high blood pressure. Heart rate and blood pressure. Body temperature. Skin for abnormal spots. What immunizations do I need?  Vaccines are usually given at various ages, according to a schedule. Your health care provider will recommend vaccines for you based on your age, medical history, and lifestyle or other factors, such as travel or where you work. What tests do I need? Screening Your health care provider may recommend screening tests for certain conditions. This may include: Lipid and cholesterol levels. Diabetes screening. This is done by checking your blood sugar (glucose) after you have not eaten for a while (fasting). Hepatitis C test. Hepatitis B test. HIV (human  immunodeficiency virus) test. STI (sexually transmitted infection) testing, if you are at risk. Lung cancer screening. Colorectal cancer screening. Prostate cancer screening. Abdominal aortic aneurysm (AAA) screening. You may need this if you are a current or former smoker. Talk with your health care provider about your test results, treatment options, and if necessary, the need for more tests. Follow these instructions at home: Eating and drinking  Eat a diet that includes fresh fruits and vegetables, whole grains, lean protein, and low-fat dairy products. Limit your intake of foods with high amounts of sugar, saturated fats, and salt. Take vitamin and mineral supplements as recommended by your health care provider. Do not drink alcohol if your health care provider tells you not to drink. If you drink alcohol: Limit how much you have to 0-2 drinks a day. Know how much alcohol is in your drink. In the [place] one drink equals one 12 oz bottle of beer (355 mL), one 5 oz glass of wine (148 mL), or one 1 oz glass of hard liquor (44 mL). Lifestyle Brush your teeth every morning and night with fluoride toothpaste. Floss one time each day. Exercise for at least 30 minutes 5 or more days each week. Do not use any products that contain nicotine or tobacco. These products include cigarettes, chewing tobacco, and vaping devices, such as e-cigarettes. If you need help quitting, ask your health care provider. Do not use drugs. If you are sexually active, practice safe sex. Use a condom or other form of protection to prevent STIs. Take aspirin only as told by your health care provider. Make sure that you understand how much to take and what form to take. Work with your health care provider to find out whether it is safe  and beneficial for you to take aspirin daily. Ask your health care provider if you need to take a cholesterol-lowering medicine (statin). Find healthy ways to manage stress, such  as: Meditation, yoga, or listening to music. Journaling. Talking to a trusted person. Spending time with friends and family. Safety Always wear your seat belt while driving or riding in a vehicle. Do not drive: If you have been drinking alcohol. Do not ride with someone who has been drinking. When you are tired or distracted. While texting. If you have been using any mind-altering substances or drugs. Wear a helmet and other protective equipment during sports activities. If you have firearms in your house, make sure you follow all gun safety procedures. Minimize exposure to UV radiation to reduce your risk of skin cancer. What's next? Visit your health care provider once a year for an annual wellness visit. Ask your health care provider how often you should have your eyes and teeth checked. Stay up to date on all vaccines. This information is not intended to replace advice given to you by your health care provider. Make sure you discuss any questions you have with your health care provider. Document Revised: 01/08/2021 Document Reviewed: 01/08/2021 [employer]vier Patient Education  2024 [employer].

## 2024-08-29 NOTE — Assessment & Plan Note (Signed)
 BP continues to remain elevated.  Increased amlodipine  to 10mg  daily.  Return in 2 weeks for nurse visit to recheck BP.

## 2024-08-30 LAB — CBC WITH DIFFERENTIAL/PLATELET
Basophils Absolute: 0 10*3/uL (ref 0.0–0.2)
Basos: 1 %
EOS (ABSOLUTE): 0.4 10*3/uL (ref 0.0–0.4)
Eos: 6 %
Hematocrit: 49.6 % (ref 37.5–51.0)
Hemoglobin: 16.5 g/dL (ref 13.0–17.7)
Immature Grans (Abs): 0 10*3/uL (ref 0.0–0.1)
Immature Granulocytes: 0 %
Lymphocytes Absolute: 1.4 10*3/uL (ref 0.7–3.1)
Lymphs: 20 %
MCH: 30.4 pg (ref 26.6–33.0)
MCHC: 33.3 g/dL (ref 31.5–35.7)
MCV: 91 fL (ref 79–97)
Monocytes Absolute: 1 10*3/uL — ABNORMAL HIGH (ref 0.1–0.9)
Monocytes: 14 %
Neutrophils Absolute: 4.3 10*3/uL (ref 1.4–7.0)
Neutrophils: 59 %
Platelets: 294 10*3/uL (ref 150–450)
RBC: 5.43 x10E6/uL (ref 4.14–5.80)
RDW: 12.3 % (ref 11.6–15.4)
WBC: 7.1 10*3/uL (ref 3.4–10.8)

## 2024-08-30 LAB — LIPID PANEL WITH LDL/HDL RATIO
Cholesterol, Total: 201 mg/dL — ABNORMAL HIGH (ref 100–199)
HDL: 48 mg/dL
LDL Chol Calc (NIH): 122 mg/dL — ABNORMAL HIGH (ref 0–99)
LDL/HDL Ratio: 2.5 ratio (ref 0.0–3.6)
Triglycerides: 175 mg/dL — ABNORMAL HIGH (ref 0–149)
VLDL Cholesterol Cal: 31 mg/dL (ref 5–40)

## 2024-08-30 LAB — CMP14+EGFR
ALT: 20 [IU]/L (ref 0–44)
AST: 17 [IU]/L (ref 0–40)
Albumin: 4.4 g/dL (ref 3.9–4.9)
Alkaline Phosphatase: 80 [IU]/L (ref 47–123)
BUN/Creatinine Ratio: 11 (ref 10–24)
BUN: 20 mg/dL (ref 8–27)
Bilirubin Total: 1.1 mg/dL (ref 0.0–1.2)
CO2: 20 mmol/L (ref 20–29)
Calcium: 9.7 mg/dL (ref 8.6–10.2)
Chloride: 105 mmol/L (ref 96–106)
Creatinine, Ser: 1.76 mg/dL — ABNORMAL HIGH (ref 0.76–1.27)
Globulin, Total: 2 g/dL (ref 1.5–4.5)
Glucose: 89 mg/dL (ref 70–99)
Potassium: 4.6 mmol/L (ref 3.5–5.2)
Sodium: 138 mmol/L (ref 134–144)
Total Protein: 6.4 g/dL (ref 6.0–8.5)
eGFR: 41 mL/min/{1.73_m2} — ABNORMAL LOW

## 2024-08-30 LAB — PSA: Prostate Specific Ag, Serum: 2.9 ng/mL (ref 0.0–4.0)

## 2024-09-13 ENCOUNTER — Ambulatory Visit

## 2025-02-27 ENCOUNTER — Ambulatory Visit: Admitting: Family Medicine
